# Patient Record
Sex: Female | Born: 1985 | Race: White | Hispanic: No | Marital: Single | Smoking: Current every day smoker
Health system: Southern US, Community
[De-identification: ages and names within clinical notes are randomized; demographics above are authoritative.]

## PROBLEM LIST (undated history)

## (undated) DIAGNOSIS — N200 Calculus of kidney: Secondary | ICD-10-CM

## (undated) DIAGNOSIS — N390 Urinary tract infection, site not specified: Secondary | ICD-10-CM

## (undated) DIAGNOSIS — S060XAA Concussion with loss of consciousness status unknown, initial encounter: Secondary | ICD-10-CM

## (undated) DIAGNOSIS — S060X9A Concussion with loss of consciousness of unspecified duration, initial encounter: Secondary | ICD-10-CM

---

## 2012-02-24 ENCOUNTER — Emergency Department: Payer: Self-pay | Admitting: Emergency Medicine

## 2012-05-29 ENCOUNTER — Emergency Department: Payer: Self-pay | Admitting: Emergency Medicine

## 2013-01-15 ENCOUNTER — Emergency Department: Payer: Self-pay | Admitting: Emergency Medicine

## 2013-08-13 ENCOUNTER — Emergency Department: Payer: Self-pay | Admitting: Emergency Medicine

## 2013-08-13 LAB — COMPREHENSIVE METABOLIC PANEL
ALBUMIN: 4 g/dL (ref 3.4–5.0)
Alkaline Phosphatase: 58 U/L
Anion Gap: 6 — ABNORMAL LOW (ref 7–16)
BILIRUBIN TOTAL: 0.5 mg/dL (ref 0.2–1.0)
BUN: 10 mg/dL (ref 7–18)
Calcium, Total: 9.1 mg/dL (ref 8.5–10.1)
Chloride: 108 mmol/L — ABNORMAL HIGH (ref 98–107)
Co2: 24 mmol/L (ref 21–32)
Creatinine: 0.86 mg/dL (ref 0.60–1.30)
EGFR (Non-African Amer.): >60
Glucose: 95 mg/dL (ref 65–99)
OSMOLALITY: 275 (ref 275–301)
Potassium: 3.4 mmol/L — ABNORMAL LOW (ref 3.5–5.1)
SGOT(AST): 23 U/L (ref 15–37)
SGPT (ALT): 16 U/L (ref 12–78)
Sodium: 138 mmol/L (ref 136–145)
TOTAL PROTEIN: 7.8 g/dL (ref 6.4–8.2)

## 2013-08-13 LAB — CBC WITH DIFFERENTIAL/PLATELET
BASOS PCT: 0.8 %
Basophil #: 0.1 10*3/uL (ref 0.0–0.1)
EOS PCT: 1.5 %
Eosinophil #: 0.1 10*3/uL (ref 0.0–0.7)
HCT: 40.3 % (ref 35.0–47.0)
HGB: 13.8 g/dL (ref 12.0–16.0)
Lymphocyte #: 2.9 10*3/uL (ref 1.0–3.6)
Lymphocyte %: 36.3 %
MCH: 32.9 pg (ref 26.0–34.0)
MCHC: 34.1 g/dL (ref 32.0–36.0)
MCV: 96 fL (ref 80–100)
MONO ABS: 0.5 x10 3/mm (ref 0.2–0.9)
Monocyte %: 5.6 %
Neutrophil #: 4.5 10*3/uL (ref 1.4–6.5)
Neutrophil %: 55.8 %
Platelet: 267 10*3/uL (ref 150–440)
RBC: 4.18 10*6/uL (ref 3.80–5.20)
RDW: 12.8 % (ref 11.5–14.5)
WBC: 8.1 10*3/uL (ref 3.6–11.0)

## 2013-08-13 LAB — LIPASE, BLOOD: Lipase: 242 U/L (ref 73–393)

## 2013-08-13 LAB — URINALYSIS, COMPLETE
Bilirubin,UR: NEGATIVE
Blood: NEGATIVE
GLUCOSE, UR: NEGATIVE mg/dL (ref 0–75)
Ketone: NEGATIVE
Nitrite: NEGATIVE
Ph: 5 (ref 4.5–8.0)
Protein: 30
RBC,UR: 11 /HPF (ref 0–5)
Specific Gravity: 1.024 (ref 1.003–1.030)
WBC UR: 8 /HPF (ref 0–5)

## 2013-08-15 ENCOUNTER — Emergency Department: Payer: Self-pay | Admitting: Emergency Medicine

## 2013-08-15 LAB — CBC
HCT: 40.9 % (ref 35.0–47.0)
HGB: 14 g/dL (ref 12.0–16.0)
MCH: 32.5 pg (ref 26.0–34.0)
MCHC: 34.1 g/dL (ref 32.0–36.0)
MCV: 95 fL (ref 80–100)
Platelet: 274 10*3/uL (ref 150–440)
RBC: 4.29 10*6/uL (ref 3.80–5.20)
RDW: 12.9 % (ref 11.5–14.5)
WBC: 10.2 10*3/uL (ref 3.6–11.0)

## 2013-08-15 LAB — COMPREHENSIVE METABOLIC PANEL
ANION GAP: 7 (ref 7–16)
Albumin: 4.1 g/dL (ref 3.4–5.0)
Alkaline Phosphatase: 60 U/L
BUN: 9 mg/dL (ref 7–18)
Bilirubin,Total: 0.4 mg/dL (ref 0.2–1.0)
CALCIUM: 9.1 mg/dL (ref 8.5–10.1)
CREATININE: 0.57 mg/dL — AB (ref 0.60–1.30)
Chloride: 109 mmol/L — ABNORMAL HIGH (ref 98–107)
Co2: 21 mmol/L (ref 21–32)
EGFR (African American): >60
Glucose: 88 mg/dL (ref 65–99)
Osmolality: 272 (ref 275–301)
Potassium: 4.4 mmol/L (ref 3.5–5.1)
SGOT(AST): 52 U/L — ABNORMAL HIGH (ref 15–37)
SGPT (ALT): 20 U/L (ref 12–78)
Sodium: 137 mmol/L (ref 136–145)
TOTAL PROTEIN: 8.2 g/dL (ref 6.4–8.2)

## 2013-08-15 LAB — HCG, QUANTITATIVE, PREGNANCY

## 2013-08-15 LAB — LIPASE, BLOOD: LIPASE: 217 U/L (ref 73–393)

## 2014-09-18 ENCOUNTER — Emergency Department
Admit: 2014-09-18 | Disposition: A | Discharge status: Home / Self Care | Payer: Self-pay | Admitting: Emergency Medicine

## 2014-09-18 LAB — URINALYSIS, COMPLETE
Bilirubin,UR: NEGATIVE
Glucose,UR: 50 mg/dL (ref 0–75)
Hyaline Cast: 10
LEUKOCYTE ESTERASE: NEGATIVE
Nitrite: NEGATIVE
Ph: 5 (ref 4.5–8.0)
Protein: 100
RBC,UR: 279 /HPF (ref 0–5)
Specific Gravity: 1.023 (ref 1.003–1.030)
Squamous Epithelial: 9

## 2014-09-18 LAB — COMPREHENSIVE METABOLIC PANEL
ALBUMIN: 4.7 g/dL
ANION GAP: 10 (ref 7–16)
Alkaline Phosphatase: 55 U/L
BILIRUBIN TOTAL: 0.7 mg/dL
BUN: 15 mg/dL
Calcium, Total: 9.4 mg/dL
Chloride: 107 mmol/L
Co2: 21 mmol/L — ABNORMAL LOW
Creatinine: 1.01 mg/dL — ABNORMAL HIGH
EGFR (African American): >60
EGFR (Non-African Amer.): >60
Glucose: 100 mg/dL — ABNORMAL HIGH
Potassium: 3.7 mmol/L
SGOT(AST): 27 U/L
SGPT (ALT): 20 U/L
Sodium: 138 mmol/L
TOTAL PROTEIN: 8.1 g/dL

## 2014-09-18 LAB — CBC
HCT: 41.2 % (ref 35.0–47.0)
HGB: 13.7 g/dL (ref 12.0–16.0)
MCH: 31.5 pg (ref 26.0–34.0)
MCHC: 33.2 g/dL (ref 32.0–36.0)
MCV: 95 fL (ref 80–100)
PLATELETS: 310 10*3/uL (ref 150–440)
RBC: 4.34 10*6/uL (ref 3.80–5.20)
RDW: 12.8 % (ref 11.5–14.5)
WBC: 13.8 10*3/uL — ABNORMAL HIGH (ref 3.6–11.0)

## 2014-09-18 LAB — LIPASE, BLOOD: LIPASE: 52 U/L — AB

## 2014-12-20 ENCOUNTER — Encounter: Payer: Self-pay | Admitting: *Deleted

## 2014-12-20 ENCOUNTER — Emergency Department
Admission: EM | Admit: 2014-12-20 | Discharge: 2014-12-20 | Disposition: A | Discharge status: Home / Self Care | Payer: BC Managed Care – PPO | Attending: Emergency Medicine | Admitting: Emergency Medicine

## 2014-12-20 DIAGNOSIS — R109 Unspecified abdominal pain: Secondary | ICD-10-CM | POA: Diagnosis present

## 2014-12-20 DIAGNOSIS — Z3202 Encounter for pregnancy test, result negative: Secondary | ICD-10-CM | POA: Insufficient documentation

## 2014-12-20 DIAGNOSIS — N200 Calculus of kidney: Secondary | ICD-10-CM | POA: Diagnosis not present

## 2014-12-20 HISTORY — DX: Calculus of kidney: N20.0

## 2014-12-20 LAB — CBC WITH DIFFERENTIAL/PLATELET
Basophils Absolute: 0.1 10*3/uL (ref 0–0.1)
Basophils Relative: 1 %
EOS PCT: 2 %
Eosinophils Absolute: 0.3 10*3/uL (ref 0–0.7)
HCT: 38.4 % (ref 35.0–47.0)
HEMOGLOBIN: 12.8 g/dL (ref 12.0–16.0)
LYMPHS ABS: 2 10*3/uL (ref 1.0–3.6)
Lymphocytes Relative: 15 %
MCH: 31.6 pg (ref 26.0–34.0)
MCHC: 33.3 g/dL (ref 32.0–36.0)
MCV: 95 fL (ref 80.0–100.0)
MONOS PCT: 6 %
Monocytes Absolute: 0.8 10*3/uL (ref 0.2–0.9)
Neutro Abs: 9.8 10*3/uL — ABNORMAL HIGH (ref 1.4–6.5)
Neutrophils Relative %: 76 %
PLATELETS: 265 10*3/uL (ref 150–440)
RBC: 4.04 MIL/uL (ref 3.80–5.20)
RDW: 13.1 % (ref 11.5–14.5)
WBC: 12.9 10*3/uL — AB (ref 3.6–11.0)

## 2014-12-20 LAB — BASIC METABOLIC PANEL
Anion gap: 8 (ref 5–15)
BUN: 13 mg/dL (ref 6–20)
CO2: 24 mmol/L (ref 22–32)
Calcium: 9.2 mg/dL (ref 8.9–10.3)
Chloride: 109 mmol/L (ref 101–111)
Creatinine, Ser: 0.78 mg/dL (ref 0.44–1.00)
GFR calc Af Amer: >60 mL/min (ref >60)
GFR calc non Af Amer: >60 mL/min (ref >60)
Glucose, Bld: 104 mg/dL — ABNORMAL HIGH (ref 65–99)
Potassium: 4 mmol/L (ref 3.5–5.1)
Sodium: 141 mmol/L (ref 135–145)

## 2014-12-20 LAB — POCT PREGNANCY, URINE: Preg Test, Ur: NEGATIVE

## 2014-12-20 LAB — URINALYSIS COMPLETE WITH MICROSCOPIC (ARMC ONLY)
Bilirubin Urine: NEGATIVE
Glucose, UA: NEGATIVE mg/dL
Ketones, ur: NEGATIVE mg/dL
Nitrite: NEGATIVE
Protein, ur: 30 mg/dL — AB
Specific Gravity, Urine: 1.021 (ref 1.005–1.030)
pH: 5 (ref 5.0–8.0)

## 2014-12-20 MED ORDER — ONDANSETRON HCL 4 MG/2ML IJ SOLN
4.0000 mg | Freq: Once | INTRAMUSCULAR | Status: AC
  Administered 2014-12-20: 4 mg via INTRAVENOUS
  Filled 2014-12-20: qty 2

## 2014-12-20 MED ORDER — OXYCODONE-ACETAMINOPHEN 5-325 MG PO TABS: 1.0000 | ORAL_TABLET | ORAL | Status: DC | PRN

## 2014-12-20 MED ORDER — KETOROLAC TROMETHAMINE 30 MG/ML IJ SOLN
30.0000 mg | Freq: Once | INTRAMUSCULAR | Status: AC
  Administered 2014-12-20: 30 mg via INTRAVENOUS
  Filled 2014-12-20: qty 1

## 2014-12-20 MED ORDER — CEPHALEXIN 500 MG PO CAPS: 500.0000 mg | ORAL_CAPSULE | Freq: Three times a day (TID) | ORAL | Status: AC

## 2014-12-20 MED ORDER — MORPHINE SULFATE 4 MG/ML IJ SOLN
4.0000 mg | Freq: Once | INTRAMUSCULAR | Status: AC
  Administered 2014-12-20: 4 mg via INTRAVENOUS
  Filled 2014-12-20: qty 1

## 2014-12-20 MED ORDER — TAMSULOSIN HCL 0.4 MG PO CAPS: 0.4000 mg | ORAL_CAPSULE | Freq: Every day | ORAL | Status: DC

## 2014-12-20 NOTE — Discharge Instructions (Signed)
Please seek medical attention for any high fevers, chest pain, shortness of breath, change in behavior, persistent vomiting, bloody stool or any other new or concerning symptoms. ° ° °Kidney Stones °Kidney stones (urolithiasis) are deposits that form inside your kidneys. The intense pain is caused by the stone moving through the urinary tract. When the stone moves, the ureter goes into spasm around the stone. The stone is usually passed in the urine.  °CAUSES  °· A disorder that makes certain neck glands produce too much parathyroid hormone (primary hyperparathyroidism). °· A buildup of uric acid crystals, similar to gout in your joints. °· Narrowing (stricture) of the ureter. °· A kidney obstruction present at birth (congenital obstruction). °· Previous surgery on the kidney or ureters. °· Numerous kidney infections. °SYMPTOMS  °· Feeling sick to your stomach (nauseous). °· Throwing up (vomiting). °· Blood in the urine (hematuria). °· Pain that usually spreads (radiates) to the groin. °· Frequency or urgency of urination. °DIAGNOSIS  °· Taking a history and physical exam. °· Blood or urine tests. °· CT scan. °· Occasionally, an examination of the inside of the urinary bladder (cystoscopy) is performed. °TREATMENT  °· Observation. °· Increasing your fluid intake. °· Extracorporeal shock wave lithotripsy--This is a noninvasive procedure that uses shock waves to break up kidney stones. °· Surgery may be needed if you have severe pain or persistent obstruction. There are various surgical procedures. Most of the procedures are performed with the use of small instruments. Only small incisions are needed to accommodate these instruments, so recovery time is minimized. °The size, location, and chemical composition are all important variables that will determine the proper choice of action for you. Talk to your health care provider to better understand your situation so that you will minimize the risk of injury to yourself  and your kidney.  °HOME CARE INSTRUCTIONS  °· Drink enough water and fluids to keep your urine clear or pale yellow. This will help you to pass the stone or stone fragments. °· Strain all urine through the provided strainer. Keep all particulate matter and stones for your health care provider to see. The stone causing the pain may be as small as a grain of salt. It is very important to use the strainer each and every time you pass your urine. The collection of your stone will allow your health care provider to analyze it and verify that a stone has actually passed. The stone analysis will often identify what you can do to reduce the incidence of recurrences. °· Only take over-the-counter or prescription medicines for pain, discomfort, or fever as directed by your health care provider. °· Make a follow-up appointment with your health care provider as directed. °· Get follow-up X-rays if required. The absence of pain does not always mean that the stone has passed. It may have only stopped moving. If the urine remains completely obstructed, it can cause loss of kidney function or even complete destruction of the kidney. It is your responsibility to make sure X-rays and follow-ups are completed. Ultrasounds of the kidney can show blockages and the status of the kidney. Ultrasounds are not associated with any radiation and can be performed easily in a matter of minutes. °SEEK MEDICAL CARE IF: °· You experience pain that is progressive and unresponsive to any pain medicine you have been prescribed. °SEEK IMMEDIATE MEDICAL CARE IF:  °· Pain cannot be controlled with the prescribed medicine. °· You have a fever or shaking chills. °· The severity or intensity   of pain increases over 18 hours and is not relieved by pain medicine. °· You develop a new onset of abdominal pain. °· You feel faint or pass out. °· You are unable to urinate. °MAKE SURE YOU:  °· Understand these instructions. °· Will watch your condition. °· Will get  help right away if you are not doing well or get worse. °Document Released: 05/31/2005 Document Revised: 01/31/2013 Document Reviewed: 11/01/2012 °Exit[employer]® Patient Information ©2015 [employer]. This information is not intended to replace advice given to you by your health care provider. Make sure you discuss any questions you have with your health care provider. ° °

## 2014-12-20 NOTE — ED Provider Notes (Signed)
[facility] Emergency Department Provider Note    ____________________________________________  Time seen: 0320  I have reviewed the triage vital signs and the nursing notes.   HISTORY  Chief Complaint Flank Pain   History limited by: Not Limited   HPI Bridget Hughes is a 29 y.o. female who presents to the emergency department today because of concerns for right-sided abdominal and flank pain. She states it started suddenly 2 hours ago. She describes it as sharp. It does not radiate. It is been constant. She has had some nausea associated with it. She states she has had similar pain in the past. This was when she was diagnosed with a kidney stone roughly 1 month ago. She states she did pass a roughly 5 mm kidney stone. She denies any fevers.     Past Medical History  Diagnosis Date  . Renal stone     There are no active problems to display for this patient.   History reviewed. No pertinent past surgical history.  No current outpatient prescriptions on file.  Allergies Review of patient's allergies indicates no known allergies.  No family history on file.  Social History History  Substance Use Topics  . Smoking status: Not on file  . Smokeless tobacco: Not on file  . Alcohol Use: Not on file    Review of Systems  Constitutional: Negative for fever. Cardiovascular: Negative for chest pain. Respiratory: Negative for shortness of breath. Gastrointestinal: Right-sided flank and abdominal pain. Genitourinary: Negative for dysuria. Musculoskeletal: Negative for back pain. Skin: Negative for rash. Neurological: Negative for headaches, focal weakness or numbness.   10-point ROS otherwise negative.  ____________________________________________   PHYSICAL EXAM:  VITAL SIGNS: ED Triage Vitals  Enc Vitals Group     BP 12/20/14 0252 140/97 mmHg     Pulse Rate 12/20/14 0252 93     Resp --      Temp 12/20/14 0252 97.9 F (36.6 C)     Temp Source 12/20/14 0252 Oral     SpO2 12/20/14 0252 98 %     Weight 12/20/14 0252 140 lb (63.504 kg)     Height 12/20/14 0252 5\' 4"  (1.626 m)     Head Cir --      Peak Flow --      Pain Score 12/20/14 0253 10   Constitutional: Alert and oriented. Well appearing and in no distress. Eyes: Conjunctivae are normal. PERRL. Normal extraocular movements. ENT   Head: Normocephalic and atraumatic.   Nose: No congestion/rhinnorhea.   Mouth/Throat: Mucous membranes are moist.   Neck: No stridor. Hematological/Lymphatic/Immunilogical: No cervical lymphadenopathy. Cardiovascular: Normal rate, regular rhythm.  No murmurs, rubs, or gallops. Respiratory: Normal respiratory effort without tachypnea nor retractions. Breath sounds are clear and equal bilaterally. No wheezes/rales/rhonchi. Gastrointestinal: Soft and nontender. No distention. There is no CVA tenderness. Genitourinary: Deferred Musculoskeletal: Normal range of motion in all extremities. No joint effusions.  No lower extremity tenderness nor edema. Neurologic:  Normal speech and language. No gross focal neurologic deficits are appreciated. Speech is normal.  Skin:  Skin is warm, dry and intact. No rash noted. Psychiatric: Mood and affect are normal. Speech and behavior are normal. Patient exhibits appropriate insight and judgment.  ____________________________________________    LABS (pertinent positives/negatives)  Labs Reviewed  URINALYSIS COMPLETEWITH MICROSCOPIC ([facility] ONLY) - Abnormal; Notable for the following:    Color, Urine YELLOW (*)    APPearance HAZY (*)    Hgb urine dipstick 3+ (*)    Protein, ur 30 (*)  Leukocytes, UA TRACE (*)    Bacteria, UA RARE (*)    Squamous Epithelial / LPF 0-5 (*)    All other components within normal limits  BASIC METABOLIC PANEL - Abnormal; Notable for the following:    Glucose, Bld 104 (*)    All other components within normal limits  CBC WITH DIFFERENTIAL/PLATELET -  Abnormal; Notable for the following:    WBC 12.9 (*)    Neutro Abs 9.8 (*)    All other components within normal limits  POCT PREGNANCY, URINE     ____________________________________________   EKG  none  ____________________________________________    RADIOLOGY  Bedside US renal: mild hydro on the right side  ____________________________________________   PROCEDURES  Procedure(s) performed: None  Critical Care performed: No  ____________________________________________   INITIAL IMPRESSION / ASSESSMENT AND PLAN / ED COURSE  Pertinent labs & imaging results that were available during my care of the patient were reviewed by me and considered in my medical decision making (see chart for details).  Patient presents with right-sided flank and abdominal pain. Patient appears to have kidney stone based on urine. Some leukocytosis so will give antibiotics.   ____________________________________________   FINAL CLINICAL IMPRESSION(S) / ED DIAGNOSES  Final diagnoses:  Kidney stone     Phineas Semen, MD 12/20/14 [phone]

## 2014-12-20 NOTE — ED Notes (Signed)
Pt states she is here a month ago with a kidney stone and feels the same tonight. Pain began 2 hours ago. Pain is right flank.

## 2014-12-26 ENCOUNTER — Emergency Department: Payer: BC Managed Care – PPO

## 2014-12-26 ENCOUNTER — Emergency Department
Admission: EM | Admit: 2014-12-26 | Discharge: 2014-12-26 | Disposition: A | Discharge status: Home / Self Care | Payer: BC Managed Care – PPO | Attending: Emergency Medicine | Admitting: Emergency Medicine

## 2014-12-26 ENCOUNTER — Encounter: Payer: Self-pay | Admitting: Emergency Medicine

## 2014-12-26 DIAGNOSIS — Z3202 Encounter for pregnancy test, result negative: Secondary | ICD-10-CM | POA: Insufficient documentation

## 2014-12-26 DIAGNOSIS — R1110 Vomiting, unspecified: Secondary | ICD-10-CM | POA: Insufficient documentation

## 2014-12-26 DIAGNOSIS — R109 Unspecified abdominal pain: Secondary | ICD-10-CM | POA: Diagnosis present

## 2014-12-26 DIAGNOSIS — N23 Unspecified renal colic: Secondary | ICD-10-CM | POA: Insufficient documentation

## 2014-12-26 LAB — CBC WITH DIFFERENTIAL/PLATELET
BASOS PCT: 1 %
Basophils Absolute: 0.1 10*3/uL (ref 0–0.1)
EOS PCT: 3 %
Eosinophils Absolute: 0.3 10*3/uL (ref 0–0.7)
HEMATOCRIT: 42.5 % (ref 35.0–47.0)
Hemoglobin: 14.1 g/dL (ref 12.0–16.0)
LYMPHS ABS: 1.8 10*3/uL (ref 1.0–3.6)
Lymphocytes Relative: 14 %
MCH: 31.3 pg (ref 26.0–34.0)
MCHC: 33.2 g/dL (ref 32.0–36.0)
MCV: 94.3 fL (ref 80.0–100.0)
MONO ABS: 0.8 10*3/uL (ref 0.2–0.9)
Monocytes Relative: 6 %
NEUTROS PCT: 76 %
Neutro Abs: 10.1 10*3/uL — ABNORMAL HIGH (ref 1.4–6.5)
PLATELETS: 267 10*3/uL (ref 150–440)
RBC: 4.5 MIL/uL (ref 3.80–5.20)
RDW: 13.2 % (ref 11.5–14.5)
WBC: 13.1 10*3/uL — AB (ref 3.6–11.0)

## 2014-12-26 LAB — PREGNANCY, URINE: Preg Test, Ur: NEGATIVE

## 2014-12-26 LAB — URINALYSIS COMPLETE WITH MICROSCOPIC (ARMC ONLY)
BILIRUBIN URINE: NEGATIVE
Glucose, UA: NEGATIVE mg/dL
KETONES UR: NEGATIVE mg/dL
Nitrite: NEGATIVE
PROTEIN: NEGATIVE mg/dL
SPECIFIC GRAVITY, URINE: 1.02 (ref 1.005–1.030)
pH: 5 (ref 5.0–8.0)

## 2014-12-26 LAB — BASIC METABOLIC PANEL
Anion gap: 10 (ref 5–15)
BUN: 14 mg/dL (ref 6–20)
CALCIUM: 9.1 mg/dL (ref 8.9–10.3)
CO2: 24 mmol/L (ref 22–32)
Chloride: 103 mmol/L (ref 101–111)
Creatinine, Ser: 0.9 mg/dL (ref 0.44–1.00)
GFR calc Af Amer: >60 mL/min (ref >60)
GLUCOSE: 90 mg/dL (ref 65–99)
Potassium: 3.9 mmol/L (ref 3.5–5.1)
SODIUM: 137 mmol/L (ref 135–145)

## 2014-12-26 LAB — POCT PREGNANCY, URINE: PREG TEST UR: NEGATIVE

## 2014-12-26 MED ORDER — ONDANSETRON HCL 4 MG/2ML IJ SOLN
4.0000 mg | Freq: Once | INTRAMUSCULAR | Status: AC
  Administered 2014-12-26: 4 mg via INTRAVENOUS

## 2014-12-26 MED ORDER — HYDROMORPHONE HCL 1 MG/ML IJ SOLN
INTRAMUSCULAR | Status: AC
  Administered 2014-12-26: 1 mg via INTRAVENOUS
  Filled 2014-12-26: qty 1

## 2014-12-26 MED ORDER — KETOROLAC TROMETHAMINE 30 MG/ML IJ SOLN
INTRAMUSCULAR | Status: AC
  Administered 2014-12-26: 30 mg via INTRAVENOUS
  Filled 2014-12-26: qty 1

## 2014-12-26 MED ORDER — KETOROLAC TROMETHAMINE 10 MG PO TABS: 10.0000 mg | ORAL_TABLET | Freq: Four times a day (QID) | ORAL | Status: DC | PRN

## 2014-12-26 MED ORDER — SODIUM CHLORIDE 0.9 % IV SOLN
Freq: Once | INTRAVENOUS | Status: AC
  Administered 2014-12-26: 16:00:00 via INTRAVENOUS

## 2014-12-26 MED ORDER — OXYCODONE-ACETAMINOPHEN 5-325 MG PO TABS: 1.0000 | ORAL_TABLET | Freq: Four times a day (QID) | ORAL | Status: DC | PRN

## 2014-12-26 MED ORDER — HYDROMORPHONE HCL 1 MG/ML IJ SOLN
1.0000 mg | Freq: Once | INTRAMUSCULAR | Status: AC
  Administered 2014-12-26: 1 mg via INTRAVENOUS

## 2014-12-26 MED ORDER — KETOROLAC TROMETHAMINE 30 MG/ML IJ SOLN
30.0000 mg | Freq: Once | INTRAMUSCULAR | Status: AC
  Administered 2014-12-26: 30 mg via INTRAVENOUS

## 2014-12-26 MED ORDER — ONDANSETRON HCL 4 MG/2ML IJ SOLN
INTRAMUSCULAR | Status: AC
Start: 2014-12-26 — End: 2014-12-26
  Administered 2014-12-26: 4 mg via INTRAVENOUS
  Filled 2014-12-26: qty 2

## 2014-12-26 NOTE — ED Notes (Signed)
Reports dx with kidney stone last wk.  States unable to get f/u apt for another week.  Still having pain

## 2014-12-26 NOTE — ED Notes (Signed)
Pt states diagnosis of a kidney stone last week, pt states she is unable to get into urologist, out of pain medicine and still in pain

## 2014-12-26 NOTE — ED Provider Notes (Signed)
[facility] Emergency Department Provider Note     Time seen: ----------------------------------------- 3:06 PM on 12/26/2014 -----------------------------------------    I have reviewed the triage vital signs and the nursing notes.   HISTORY  Chief Complaint Flank Pain    HPI Bridget Hughes is a 29 y.o. female who presents ER for repeat evaluation for her kidney stone. Patient states she was diagnosed with a kidney stone last week and states she is unable to get to the urologist. Patient states she is out of pain medication and still having significant pain. Patient denies fevers chills other complaints, has had some nausea however. Pain is severe at this time   Past Medical History  Diagnosis Date  . Renal stone     There are no active problems to display for this patient.   History reviewed. No pertinent past surgical history.  Allergies Review of patient's allergies indicates no known allergies.  Social History History  Substance Use Topics  . Smoking status: Never Smoker   . Smokeless tobacco: Not on file  . Alcohol Use: Yes    Review of Systems Constitutional: Negative for fever. Eyes: Negative for visual changes. ENT: Negative for sore throat. Cardiovascular: Negative for chest pain. Respiratory: Negative for shortness of breath. Gastrointestinal: Positive for right flank pain and vomiting Genitourinary: Negative for dysuria. Musculoskeletal: Negative for back pain. Skin: Negative for rash. Neurological: Negative for headaches, focal weakness or numbness.  10-point ROS otherwise negative.  ____________________________________________   PHYSICAL EXAM:  VITAL SIGNS: ED Triage Vitals  Enc Vitals Group     BP --      Pulse Rate 12/26/14 1426 82     Resp 12/26/14 1426 18     Temp 12/26/14 1426 98.1 F (36.7 C)     Temp Source 12/26/14 1426 Oral     SpO2 12/26/14 1426 99 %     Weight 12/26/14 1426 150 lb (68.04 kg)    Height 12/26/14 1426 5\' 4"  (1.626 m)     Head Cir --      Peak Flow --      Pain Score 12/26/14 1426 8     Pain Loc --      Pain Edu? --      Excl. in GC? --     Constitutional: Alert and oriented. Well appearing and in no distress. Eyes: Conjunctivae are normal. PERRL. Normal extraocular movements. ENT   Head: Normocephalic and atraumatic.   Nose: No congestion/rhinnorhea.   Mouth/Throat: Mucous membranes are moist.   Neck: No stridor. Hematological/Lymphatic/Immunilogical: No cervical lymphadenopathy. Cardiovascular: Normal rate, regular rhythm. Normal and symmetric distal pulses are present in all extremities. No murmurs, rubs, or gallops. Respiratory: Normal respiratory effort without tachypnea nor retractions. Breath sounds are clear and equal bilaterally. No wheezes/rales/rhonchi. Gastrointestinal: Right flank tenderness, no rebound or guarding. Musculoskeletal: Nontender with normal range of motion in all extremities. No joint effusions.  No lower extremity tenderness nor edema. Neurologic:  Normal speech and language. No gross focal neurologic deficits are appreciated. Speech is normal. No gait instability. Skin:  Skin is warm, dry and intact. No rash noted. Psychiatric: Mood and affect are normal. Speech and behavior are normal. Patient exhibits appropriate insight and judgment.  ____________________________________________  ED COURSE:  Pertinent labs & imaging results that were available during my care of the patient were reviewed by me and considered in my medical decision making (see chart for details). Patient with persistent renal colic, we'll obtain a KUB and repeat labs. ____________________________________________  LABS (pertinent positives/negatives)  Labs Reviewed  CBC WITH DIFFERENTIAL/PLATELET - Abnormal; Notable for the following:    WBC 13.1 (*)    Neutro Abs 10.1 (*)    All other components within normal limits  URINALYSIS COMPLETEWITH  MICROSCOPIC ([facility] ONLY) - Abnormal; Notable for the following:    Color, Urine YELLOW (*)    APPearance HAZY (*)    Hgb urine dipstick 2+ (*)    Leukocytes, UA TRACE (*)    Bacteria, UA FEW (*)    Squamous Epithelial / LPF 6-30 (*)    All other components within normal limits  BASIC METABOLIC PANEL  PREGNANCY, URINE  POCT PREGNANCY, URINE    RADIOLOGY Images were viewed by me  KUB shows progression of the right kidney stone to the right UVJ  ____________________________________________  FINAL ASSESSMENT AND PLAN  Renal colic  Plan: Patient is in no acute distress, labs and imaging as dictated above. Patient is feeling better, will continue home with oral ketorolac and Flomax.   Emily Filbert, MD   Emily Filbert, MD 12/26/14 [phone]

## 2014-12-26 NOTE — Discharge Instructions (Signed)

## 2014-12-26 NOTE — ED Notes (Signed)
POC preg negative.

## 2015-05-23 ENCOUNTER — Encounter: Payer: Self-pay | Admitting: Emergency Medicine

## 2015-05-23 ENCOUNTER — Emergency Department: Payer: BC Managed Care – PPO

## 2015-05-23 ENCOUNTER — Emergency Department
Admission: EM | Admit: 2015-05-23 | Discharge: 2015-05-23 | Disposition: A | Discharge status: Home / Self Care | Payer: BC Managed Care – PPO | Attending: Emergency Medicine | Admitting: Emergency Medicine

## 2015-05-23 DIAGNOSIS — Y92322 Soccer field as the place of occurrence of the external cause: Secondary | ICD-10-CM | POA: Insufficient documentation

## 2015-05-23 DIAGNOSIS — M2392 Unspecified internal derangement of left knee: Secondary | ICD-10-CM

## 2015-05-23 DIAGNOSIS — R110 Nausea: Secondary | ICD-10-CM | POA: Insufficient documentation

## 2015-05-23 DIAGNOSIS — M25562 Pain in left knee: Secondary | ICD-10-CM

## 2015-05-23 DIAGNOSIS — Z79899 Other long term (current) drug therapy: Secondary | ICD-10-CM | POA: Insufficient documentation

## 2015-05-23 DIAGNOSIS — Y9366 Activity, soccer: Secondary | ICD-10-CM | POA: Insufficient documentation

## 2015-05-23 DIAGNOSIS — S8992XA Unspecified injury of left lower leg, initial encounter: Secondary | ICD-10-CM | POA: Diagnosis present

## 2015-05-23 DIAGNOSIS — W01198A Fall on same level from slipping, tripping and stumbling with subsequent striking against other object, initial encounter: Secondary | ICD-10-CM | POA: Diagnosis not present

## 2015-05-23 DIAGNOSIS — Y998 Other external cause status: Secondary | ICD-10-CM | POA: Insufficient documentation

## 2015-05-23 DIAGNOSIS — S83207A Unspecified tear of unspecified meniscus, current injury, left knee, initial encounter: Secondary | ICD-10-CM | POA: Diagnosis not present

## 2015-05-23 MED ORDER — TRAMADOL HCL 50 MG PO TABS
50.0000 mg | ORAL_TABLET | Freq: Once | ORAL | Status: AC
  Administered 2015-05-23: 50 mg via ORAL
  Filled 2015-05-23: qty 1

## 2015-05-23 MED ORDER — TRAMADOL HCL 50 MG PO TABS: 50.0000 mg | ORAL_TABLET | Freq: Four times a day (QID) | ORAL | Status: DC | PRN

## 2015-05-23 MED ORDER — ONDANSETRON 4 MG PO TBDP
4.0000 mg | ORAL_TABLET | Freq: Once | ORAL | Status: AC | PRN
Start: 2015-05-23 — End: 2015-05-23
  Administered 2015-05-23: 4 mg via ORAL
  Filled 2015-05-23: qty 1

## 2015-05-23 MED ORDER — ETODOLAC 500 MG PO TABS: 500.0000 mg | ORAL_TABLET | Freq: Two times a day (BID) | ORAL | Status: DC

## 2015-05-23 NOTE — ED Notes (Signed)
Pt iced and took ibuprofen 600 mg OTC prior to arrival.

## 2015-05-23 NOTE — Discharge Instructions (Signed)
Cryotherapy °Cryotherapy means treatment with cold. Ice or gel packs can be used to reduce both pain and swelling. Ice is the most helpful within the first 24 to 48 hours after an injury or flare-up from overusing a muscle or joint. Sprains, strains, spasms, burning pain, shooting pain, and aches can all be eased with ice. Ice can also be used when recovering from surgery. Ice is effective, has very few side effects, and is safe for most people to use. °PRECAUTIONS  °Ice is not a safe treatment option for people with: °· Raynaud phenomenon. This is a condition affecting small blood vessels in the extremities. Exposure to cold may cause your problems to return. °· Cold hypersensitivity. There are many forms of cold hypersensitivity, including: °¨ Cold urticaria. Red, itchy hives appear on the skin when the tissues begin to warm after being iced. °¨ Cold erythema. This is a red, itchy rash caused by exposure to cold. °¨ Cold hemoglobinuria. Red blood cells break down when the tissues begin to warm after being iced. The hemoglobin that carry oxygen are passed into the urine because they cannot combine with blood proteins fast enough. °· Numbness or altered sensitivity in the area being iced. °If you have any of the following conditions, do not use ice until you have discussed cryotherapy with your caregiver: °· Heart conditions, such as arrhythmia, angina, or chronic heart disease. °· High blood pressure. °· Healing wounds or open skin in the area being iced. °· Current infections. °· Rheumatoid arthritis. °· Poor circulation. °· Diabetes. °Ice slows the blood flow in the region it is applied. This is beneficial when trying to stop inflamed tissues from spreading irritating chemicals to surrounding tissues. However, if you expose your skin to cold temperatures for too long or without the proper protection, you can damage your skin or nerves. Watch for signs of skin damage due to cold. °HOME CARE INSTRUCTIONS °Follow  these tips to use ice and cold packs safely. °· Place a dry or damp towel between the ice and skin. A damp towel will cool the skin more quickly, so you may need to shorten the time that the ice is used. °· For a more rapid response, add gentle compression to the ice. °· Ice for no more than 10 to 20 minutes at a time. The bonier the area you are icing, the less time it will take to get the benefits of ice. °· Check your skin after 5 minutes to make sure there are no signs of a poor response to cold or skin damage. °· Rest 20 minutes or more between uses. °· Once your skin is numb, you can end your treatment. You can test numbness by very lightly touching your skin. The touch should be so light that you do not see the skin dimple from the pressure of your fingertip. When using ice, most people will feel these normal sensations in this order: cold, burning, aching, and numbness. °· Do not use ice on someone who cannot communicate their responses to pain, such as small children or people with dementia. °HOW TO MAKE AN ICE PACK °Ice packs are the most common way to use ice therapy. Other methods include ice massage, ice baths, and cryosprays. Muscle creams that cause a cold, tingly feeling do not offer the same benefits that ice offers and should not be used as a substitute unless recommended by your caregiver. °To make an ice pack, do one of the following: °· Place crushed ice or a   bag of frozen vegetables in a sealable plastic bag. Squeeze out the excess air. Place this bag inside another plastic bag. Slide the bag into a pillowcase or place a damp towel between your skin and the bag. °· Mix 3 parts water with 1 part rubbing alcohol. Freeze the mixture in a sealable plastic bag. When you remove the mixture from the freezer, it will be slushy. Squeeze out the excess air. Place this bag inside another plastic bag. Slide the bag into a pillowcase or place a damp towel between your skin and the bag. °SEEK MEDICAL CARE  IF: °· You develop white spots on your skin. This may give the skin a blotchy (mottled) appearance. °· Your skin turns blue or pale. °· Your skin becomes waxy or hard. °· Your swelling gets worse. °MAKE SURE YOU:  °· Understand these instructions. °· Will watch your condition. °· Will get help right away if you are not doing well or get worse. °  °This information is not intended to replace advice given to you by your health care provider. Make sure you discuss any questions you have with your health care provider. °  °Document Released: 01/25/2011 Document Revised: 06/21/2014 Document Reviewed: 01/25/2011 °[employer]vier Interactive Patient Education ©2016 [employer] °Knee Pain °Knee pain is a common problem. It can have many causes. The pain often goes away by following your doctor's home care instructions. Treatment for ongoing pain will depend on the cause of your pain. If your knee pain continues, more tests may be needed to diagnose your condition. Tests may include X-rays or other imaging studies of your knee. °HOME CARE °· Take medicines only as told by your doctor. °· Rest your knee and keep it raised (elevated) while you are resting. °· Do not do things that cause pain or make your pain worse. °· Avoid activities where both feet leave the ground at the same time, such as running, jumping rope, or doing jumping jacks. °· Apply ice to the knee area: °¨ Put ice in a plastic bag. °¨ Place a towel between your skin and the bag. °¨ Leave the ice on for 20 minutes, 2-3 times a day. °· Ask your doctor if you should wear an elastic knee support. °· Sleep with a pillow under your knee. °· Lose weight if you are overweight. Being overweight can make your knee hurt more. °· Do not use any tobacco products, including cigarettes, chewing tobacco, or electronic cigarettes. If you need help quitting, ask your doctor. Smoking may slow the healing of any bone and joint problems that you may have. °GET HELP IF: °· Your knee  pain does not stop, it changes, or it gets worse. °· You have a fever along with knee pain. °· Your knee gives out or locks up. °· Your knee becomes more swollen. °GET HELP RIGHT AWAY IF:  °· Your knee feels hot to the touch. °· You have chest pain or trouble breathing. °  °This information is not intended to replace advice given to you by your health care provider. Make sure you discuss any questions you have with your health care provider. °  °Document Released: 08/27/2008 Document Revised: 06/21/2014 Document Reviewed: 08/01/2013 °Elsevier Interactive Patient Education ©2016 [employer] ° °

## 2015-05-23 NOTE — ED Notes (Signed)
Pt states she was playing indoor soccer and her knee hit the turf when she fell.  She has a hx of meniscus tears but has had no repairs done to it.  She is in 6/10 pain until it shoots then its a 10/10.  She has treated at home with ice and 600mg  ibuprofen and has not had any pain relief.

## 2015-05-23 NOTE — ED Provider Notes (Signed)
CSN: [id number]     Arrival date & time 05/23/15  2101 History   First MD Initiated Contact with Patient 05/23/15 2210     Chief Complaint  Patient presents with  . Knee Pain  . Nausea     (Consider location/radiation/quality/duration/timing/severity/associated sxs/prior Treatment) HPI  29 year old female presents to emergency department for evaluation of left knee pain. She has a known meniscus tear that was treated nonoperatively in the left knee. Just prior to arrival she was playing soccer, pivoted, went down onto her left knee and developed pain and swelling. Pain is on the medial joint line. She is unable to bear weight. She is able to straight leg raise. Pain is sharp and 10 out of 10. She did take 600 mg ibuprofen. On arrival to the emergency Department pain was so severe that she was nauseated, she was given Zofran which is completely alleviated her nausea. She denies any headaches, neck pain, nausea, vomiting, abdominal pain  Past Medical History  Diagnosis Date  . Renal stone    History reviewed. No pertinent past surgical history. No family history on file. Social History  Substance Use Topics  . Smoking status: Never Smoker   . Smokeless tobacco: None  . Alcohol Use: No   OB History    No data available     Review of Systems  Constitutional: Negative for fever, chills, activity change and fatigue.  HENT: Negative for congestion, sinus pressure and sore throat.   Eyes: Negative for visual disturbance.  Respiratory: Negative for cough, chest tightness and shortness of breath.   Cardiovascular: Negative for chest pain and leg swelling.  Gastrointestinal: Negative for nausea, vomiting, abdominal pain and diarrhea.  Genitourinary: Negative for dysuria.  Musculoskeletal: Positive for arthralgias. Negative for gait problem.  Skin: Negative for rash.  Neurological: Negative for weakness, numbness and headaches.  Hematological: Negative for adenopathy.   Psychiatric/Behavioral: Negative for behavioral problems, confusion and agitation.      Allergies  Review of patient's allergies indicates no known allergies.  Home Medications   Prior to Admission medications   Medication Sig Start Date End Date Taking? Authorizing Provider  etodolac (LODINE) 500 MG tablet Take 1 tablet (500 mg total) by mouth 2 (two) times daily. 05/23/15   Evon Slack, PA-C  ketorolac (TORADOL) 10 MG tablet Take 1 tablet (10 mg total) by mouth every 6 (six) hours as needed. 12/26/14   Emily Filbert, MD  oxyCODONE-acetaminophen (ROXICET) 5-325 MG per tablet Take 1 tablet by mouth every 4 (four) hours as needed for severe pain. 12/20/14   Phineas Semen, MD  oxyCODONE-acetaminophen (ROXICET) 5-325 MG per tablet Take 1 tablet by mouth every 6 (six) hours as needed. 12/26/14   Emily Filbert, MD  tamsulosin (FLOMAX) 0.4 MG CAPS capsule Take 1 capsule (0.4 mg total) by mouth daily. 12/20/14   Phineas Semen, MD  traMADol (ULTRAM) 50 MG tablet Take 1 tablet (50 mg total) by mouth every 6 (six) hours as needed. 05/23/15   Evon Slack, PA-C   BP 113/75 mmHg  Pulse 85  Temp(Src) 97.6 F (36.4 C) (Oral)  Resp 18  Ht  (1.6 m)  Wt 65.772 kg  BMI 25.69 kg/m2  SpO2 97%  LMP 05/23/2015 (Exact Date) Physical Exam  Constitutional: She is oriented to person, place, and time. She appears well-developed and well-nourished. No distress.  HENT:  Head: Normocephalic and atraumatic.  Mouth/Throat: Oropharynx is clear and moist.  Eyes: EOM are normal. Pupils are equal,  round, and reactive to light. Right eye exhibits no discharge. Left eye exhibits no discharge.  Neck: Normal range of motion. Neck supple.  Cardiovascular: Normal rate, regular rhythm and intact distal pulses.   Pulmonary/Chest: Effort normal and breath sounds normal. No respiratory distress. She exhibits no tenderness.  Musculoskeletal:  Examination of left knee shows the patient has 0-100 range  of motion. There is mild swelling with no effusion. She is tender on the medial joint line nontender along the lateral joint line. She is able to actively straight leg raise. There is no laxity with valgus or varus stress testing. She has a negative anterior posterior drawer test. McMurray's test is unable to be performed due to pain. No pain with hip internal or external rotation.  Neurological: She is alert and oriented to person, place, and time. She has normal reflexes.  Skin: Skin is warm and dry.  Psychiatric: She has a normal mood and affect. Her behavior is normal. Thought content normal.    ED Course  Procedures (including critical care time) Labs Review Labs Reviewed - No data to display  Imaging Review Dg Knee Complete 4 Views Left  05/23/2015  CLINICAL DATA:  Acute left knee pain after soccer injury tonight. EXAM: LEFT KNEE - COMPLETE 4+ VIEW COMPARISON:  January 15, 2013. FINDINGS: There is no evidence of fracture, dislocation, or joint effusion. There is no evidence of arthropathy or other focal bone abnormality. Soft tissues are unremarkable. IMPRESSION: Normal left knee. Electronically Signed   By: Lupita RaiderJames  Green Jr, M.D.   On: 05/23/2015 21:53   I have personally reviewed and evaluated these images and lab results as part of my medical decision-making.   EKG Interpretation None      MDM   Final diagnoses:  Left knee pain  Derangement, knee internal, left    29 year old female with left knee pain from a twisting injury. She has a no medial meniscus tear. Most likely, patient reinjured her left knee. There is no evidence of acute bony abnormality on x-rays. No signs of effusion. Patient is placed into a knee immobilizer, will wear at all times except for when sitting. She can come out to work on gentle knee range of motion. She is given crutches. Etodolac and Ultram for pain. Follow up with orthopedics in 3-5 days.    Evon Slackhomas C Tifanie Gardiner, PA-C 05/23/15 2221  Phineas SemenGraydon  Goodman, MD 05/24/15 [phone]

## 2015-07-24 ENCOUNTER — Emergency Department
Admission: EM | Admit: 2015-07-24 | Discharge: 2015-07-24 | Disposition: A | Discharge status: Home / Self Care | Payer: BC Managed Care – PPO | Attending: Student | Admitting: Student

## 2015-07-24 ENCOUNTER — Encounter: Payer: Self-pay | Admitting: *Deleted

## 2015-07-24 ENCOUNTER — Emergency Department: Payer: BC Managed Care – PPO

## 2015-07-24 DIAGNOSIS — J069 Acute upper respiratory infection, unspecified: Secondary | ICD-10-CM | POA: Diagnosis not present

## 2015-07-24 DIAGNOSIS — R0781 Pleurodynia: Secondary | ICD-10-CM | POA: Diagnosis not present

## 2015-07-24 DIAGNOSIS — Z79899 Other long term (current) drug therapy: Secondary | ICD-10-CM | POA: Diagnosis not present

## 2015-07-24 DIAGNOSIS — F17200 Nicotine dependence, unspecified, uncomplicated: Secondary | ICD-10-CM | POA: Insufficient documentation

## 2015-07-24 DIAGNOSIS — R0789 Other chest pain: Secondary | ICD-10-CM

## 2015-07-24 DIAGNOSIS — R0981 Nasal congestion: Secondary | ICD-10-CM | POA: Diagnosis present

## 2015-07-24 MED ORDER — PSEUDOEPH-BROMPHEN-DM 30-2-10 MG/5ML PO SYRP: 5.0000 mL | ORAL_SOLUTION | Freq: Four times a day (QID) | ORAL | Status: DC | PRN

## 2015-07-24 MED ORDER — PREDNISONE 10 MG PO TABS: ORAL_TABLET | ORAL | Status: DC

## 2015-07-24 NOTE — ED Provider Notes (Signed)
[facility] Emergency Department Provider Note  ____________________________________________  Time seen: Approximately 4:48 PM  I have reviewed the triage vital signs and the nursing notes.   HISTORY  Chief Complaint Nasal Congestion and Chills    HPI Bridget Hughes is a 30 y.o. female , NAD, presents to the emergency department with a one-week history of upper respiratory symptoms. Has had nasal drainage and congestion as well as cough. The last 24 hours has had increasing right-sided lower rib pain that worsens with cough. Symptoms improved with taking Sudafed but once the cough returns, the pain gets worse. Denies fever, chills, body aches. No difficulty breathing or shortness of breath.    Past Medical History  Diagnosis Date  . Renal stone     There are no active problems to display for this patient.   History reviewed. No pertinent past surgical history.  Current Outpatient Rx  Name  Route  Sig  Dispense  Refill  . brompheniramine-pseudoephedrine-DM 30-2-10 MG/5ML syrup   Oral   Take 5 mLs by mouth 4 (four) times daily as needed.   118 mL   0   . etodolac (LODINE) 500 MG tablet   Oral   Take 1 tablet (500 mg total) by mouth 2 (two) times daily.   30 tablet   0   . ketorolac (TORADOL) 10 MG tablet   Oral   Take 1 tablet (10 mg total) by mouth every 6 (six) hours as needed.   20 tablet   0   . oxyCODONE-acetaminophen (ROXICET) 5-325 MG per tablet   Oral   Take 1 tablet by mouth every 4 (four) hours as needed for severe pain.   12 tablet   0   . oxyCODONE-acetaminophen (ROXICET) 5-325 MG per tablet   Oral   Take 1 tablet by mouth every 6 (six) hours as needed.   20 tablet   0   . predniSONE (DELTASONE) 10 MG tablet      Take a daily regimen of 6,5,4,3,2,1   21 tablet   0   . tamsulosin (FLOMAX) 0.4 MG CAPS capsule   Oral   Take 1 capsule (0.4 mg total) by mouth daily.   14 capsule   0   . traMADol (ULTRAM) 50 MG  tablet   Oral   Take 1 tablet (50 mg total) by mouth every 6 (six) hours as needed.   20 tablet   0     Allergies Shellfish allergy  History reviewed. No pertinent family history.  Social History Social History  Substance Use Topics  . Smoking status: Current Every Day Smoker  . Smokeless tobacco: None  . Alcohol Use: No     Review of Systems  Constitutional: No fever/chills Eyes:  No discharge ENT: Positive nasal congestion, nasal drainage. No sore throat. Cardiovascular: No chest pain. Respiratory: Positive cough. No shortness of breath. No wheezing.  Musculoskeletal: Positive for right lateral lower rib pain. Negative for back pain.  Skin: Negative for rash. Neurological: Negative for headaches, focal weakness or numbness. 10-point ROS otherwise negative.  ____________________________________________   PHYSICAL EXAM:  VITAL SIGNS: ED Triage Vitals  Enc Vitals Group     BP 07/24/15 1627 127/89 mmHg     Pulse Rate 07/24/15 1627 84     Resp 07/24/15 1627 18     Temp 07/24/15 1627 97.6 F (36.4 C)     Temp Source 07/24/15 1627 Oral     SpO2 07/24/15 1627 100 %  Weight 07/24/15 1627 145 lb (65.772 kg)     Height 07/24/15 1627  (1.626 m)     Head Cir --      Peak Flow --      Pain Score 07/24/15 1627 0     Pain Loc --      Pain Edu? --      Excl. in GC? --     Constitutional: Alert and oriented. Well appearing and in no acute distress. Eyes: Conjunctivae are normal. PERRL. EOMI without pain.  Head: Atraumatic. ENT:      Ears: Bilateral TMs visualized without perforation. Trace serous fluid bilaterally without erythema. Bilateral external ear canals without swelling, erythema, discharge      Nose: Mild congestion with clear rhinnorhea.      Mouth/Throat: Mucous membranes are moist. Pharynx without erythema, swelling, exudate. Clear postnasal drip. Neck: Supple with full range of motion Hematological/Lymphatic/Immunilogical: No cervical  lymphadenopathy. Cardiovascular: Normal rate, regular rhythm. Normal S1 and S2.  Good peripheral circulation. Respiratory: Normal respiratory effort without tachypnea or retractions. Lungs CTAB. Musculoskeletal:  Tender to palpation over the right lateral lower rib line. No crepitus or deformities to palpation.  Neurologic:  Normal speech and language. No gross focal neurologic deficits are appreciated.  Skin:  Skin is warm, dry and intact. No rash noted. Psychiatric: Mood and affect are normal. Speech and behavior are normal. Patient exhibits appropriate insight and judgement.   ____________________________________________   LABS  None  ____________________________________________  EKG  None ____________________________________________  RADIOLOGY I have personally viewed and evaluated these images (plain radiographs) as part of my medical decision making, as well as reviewing the written report by the radiologist.  Dg Chest 2 View  07/24/2015  CLINICAL DATA:  Shortness of breath, cough and congestion EXAM: CHEST  2 VIEW COMPARISON:  None. FINDINGS: The heart size and mediastinal contours are within normal limits. There is no focal infiltrate, pulmonary edema, or pleural effusion. The visualized skeletal structures are unremarkable. IMPRESSION: No active cardiopulmonary disease. Electronically Signed   By: Sherian Rein M.D.   On: 07/24/2015 17:18    ____________________________________________    PROCEDURES  Procedure(s) performed: None    Medications - No data to display   ____________________________________________   INITIAL IMPRESSION / ASSESSMENT AND PLAN / ED COURSE  Pertinent imaging results that were available during my care of the patient were reviewed by me and considered in my medical decision making (see chart for details).  Patient's diagnosis is consistent with viral upper respiratory infection causing musculoskeletal pain due to cough. Patient will be  discharged home with prescriptions for prednisone 10 mg Dosepak to use as directed as well as Bromfed-DM cough syrup to take 10 mL by mouth every 6 hours as needed for cough and congestion. Patient advised to discontinue Sudafed and DayQuil. Patient is to follow up with [facility] if symptoms persist past this treatment course. Patient is given ED precautions to return to the ED for any worsening or new symptoms.    ____________________________________________  FINAL CLINICAL IMPRESSION(S) / ED DIAGNOSES  Final diagnoses:  Viral upper respiratory illness  Musculoskeletal chest pain      NEW MEDICATIONS STARTED DURING THIS VISIT:  New Prescriptions   BROMPHENIRAMINE-PSEUDOEPHEDRINE-DM 30-2-10 MG/5ML SYRUP    Take 5 mLs by mouth 4 (four) times daily as needed.   PREDNISONE (DELTASONE) 10 MG TABLET    Take a daily regimen of 6,5,4,3,2,1         Rosette Bellavance L Chaunda Vandergriff, PA-C  07/24/15 1729  Gayla Doss, MD 07/25/15 [phone]

## 2015-07-24 NOTE — ED Notes (Signed)
Pt c/o cold S/S for last week.  Pt came in today because of developing pain in R rib cage w/ cough.  Pt denies taking anything for muscle pain but has taken Dayquil Cold and congestion

## 2015-07-24 NOTE — Discharge Instructions (Signed)
Upper Respiratory Infection, Adult °Most upper respiratory infections (URIs) are caused by a virus. A URI affects the nose, throat, and upper air passages. The most common type of URI is often called "the common cold." °HOME CARE  °· Take medicines only as told by your doctor. °· Gargle warm saltwater or take cough drops to comfort your throat as told by your doctor. °· Use a warm mist humidifier or inhale steam from a shower to increase air moisture. This may make it easier to breathe. °· Drink enough fluid to keep your pee (urine) clear or pale yellow. °· Eat soups and other clear broths. °· Have a healthy diet. °· Rest as needed. °· Go back to work when your fever is gone or your doctor says it is okay. °¨ You may need to stay home longer to avoid giving your URI to others. °¨ You can also wear a face mask and wash your hands often to prevent spread of the virus. °· Use your inhaler more if you have asthma. °· Do not use any tobacco products, including cigarettes, chewing tobacco, or electronic cigarettes. If you need help quitting, ask your doctor. °GET HELP IF: °· You are getting worse, not better. °· Your symptoms are not helped by medicine. °· You have chills. °· You are getting more short of breath. °· You have brown or red mucus. °· You have yellow or brown discharge from your nose. °· You have pain in your face, especially when you bend forward. °· You have a fever. °· You have puffy (swollen) neck glands. °· You have pain while swallowing. °· You have white areas in the back of your throat. °GET HELP RIGHT AWAY IF:  °· You have very bad or constant: °· Headache. °· Ear pain. °· Pain in your forehead, behind your eyes, and over your cheekbones (sinus pain). °· Chest pain. °· You have long-lasting (chronic) lung disease and any of the following: °· Wheezing. °· Long-lasting cough. °· Coughing up blood. °· A change in your usual mucus. °· You have a stiff neck. °· You have changes in  your: °· Vision. °· Hearing. °· Thinking. °· Mood. °MAKE SURE YOU:  °· Understand these instructions. °· Will watch your condition. °· Will get help right away if you are not doing well or get worse. °  °This information is not intended to replace advice given to you by your health care provider. Make sure you discuss any questions you have with your health care provider. °  °Document Released: 11/17/2007 Document Revised: 10/15/2014 Document Reviewed: 09/05/2013 °Elsevier Interactive Patient Education ©2016 [employer] ° °Cough, Adult °A cough helps to clear your throat and lungs. A cough may last only 2-3 weeks (acute), or it may last longer than 8 weeks (chronic). Many different things can cause a cough. A cough may be a sign of an illness or another medical condition. °HOME CARE °· Pay attention to any changes in your cough. °· Take medicines only as told by your doctor. °¨ If you were prescribed an antibiotic medicine, take it as told by your doctor. Do not stop taking it even if you start to feel better. °¨ Talk with your doctor before you try using a cough medicine. °· Drink enough fluid to keep your pee (urine) clear or pale yellow. °· If the air is dry, use a cold steam vaporizer or humidifier in your home. °· Stay away from things that make you cough at work or at home. °·   If your cough is worse at night, try using extra pillows to raise your head up higher while you sleep. °· Do not smoke, and try not to be around smoke. If you need help quitting, ask your doctor. °· Do not have caffeine. °· Do not drink alcohol. °· Rest as needed. °GET HELP IF: °· You have new problems (symptoms). °· You cough up yellow fluid (pus). °· Your cough does not get better after 2-3 weeks, or your cough gets worse. °· Medicine does not help your cough and you are not sleeping well. °· You have pain that gets worse or pain that is not helped with medicine. °· You have a fever. °· You are losing weight and you do not know  why. °· You have night sweats. °GET HELP RIGHT AWAY IF: °· You cough up blood. °· You have trouble breathing. °· Your heartbeat is very fast. °  °This information is not intended to replace advice given to you by your health care provider. Make sure you discuss any questions you have with your health care provider. °  °Document Released: 02/11/2011 Document Revised: 02/19/2015 Document Reviewed: 08/07/2014 °Elsevier Interactive Patient Education ©2016 [employer] ° °

## 2015-07-24 NOTE — ED Notes (Signed)
Pt states cough and congestion for several days and right sided rib pain when she coughs

## 2016-10-14 ENCOUNTER — Emergency Department: Payer: BC Managed Care – PPO

## 2016-10-14 ENCOUNTER — Emergency Department
Admission: EM | Admit: 2016-10-14 | Discharge: 2016-10-14 | Disposition: A | Discharge status: Home / Self Care | Payer: BC Managed Care – PPO | Attending: Emergency Medicine | Admitting: Emergency Medicine

## 2016-10-14 DIAGNOSIS — R51 Headache: Secondary | ICD-10-CM | POA: Insufficient documentation

## 2016-10-14 DIAGNOSIS — F17200 Nicotine dependence, unspecified, uncomplicated: Secondary | ICD-10-CM | POA: Diagnosis not present

## 2016-10-14 DIAGNOSIS — Z79899 Other long term (current) drug therapy: Secondary | ICD-10-CM | POA: Insufficient documentation

## 2016-10-14 DIAGNOSIS — R55 Syncope and collapse: Secondary | ICD-10-CM | POA: Diagnosis present

## 2016-10-14 DIAGNOSIS — J029 Acute pharyngitis, unspecified: Secondary | ICD-10-CM | POA: Diagnosis not present

## 2016-10-14 DIAGNOSIS — R05 Cough: Secondary | ICD-10-CM | POA: Insufficient documentation

## 2016-10-14 HISTORY — DX: Concussion with loss of consciousness of unspecified duration, initial encounter: S06.0X9A

## 2016-10-14 HISTORY — DX: Concussion with loss of consciousness status unknown, initial encounter: S06.0XAA

## 2016-10-14 LAB — URINALYSIS, COMPLETE (UACMP) WITH MICROSCOPIC
BACTERIA UA: NONE SEEN
Bilirubin Urine: NEGATIVE
Glucose, UA: NEGATIVE mg/dL
KETONES UR: 20 mg/dL — AB
Leukocytes, UA: NEGATIVE
Nitrite: NEGATIVE
PROTEIN: NEGATIVE mg/dL
Specific Gravity, Urine: 1.021 (ref 1.005–1.030)
pH: 6 (ref 5.0–8.0)

## 2016-10-14 LAB — CBC
HCT: 41.6 % (ref 35.0–47.0)
HEMOGLOBIN: 14.3 g/dL (ref 12.0–16.0)
MCH: 32.2 pg (ref 26.0–34.0)
MCHC: 34.3 g/dL (ref 32.0–36.0)
MCV: 93.7 fL (ref 80.0–100.0)
Platelets: 311 10*3/uL (ref 150–440)
RBC: 4.44 MIL/uL (ref 3.80–5.20)
RDW: 13.2 % (ref 11.5–14.5)
WBC: 7.9 10*3/uL (ref 3.6–11.0)

## 2016-10-14 LAB — BASIC METABOLIC PANEL
Anion gap: 12 (ref 5–15)
BUN: 14 mg/dL (ref 6–20)
CALCIUM: 9.4 mg/dL (ref 8.9–10.3)
CHLORIDE: 108 mmol/L (ref 101–111)
CO2: 20 mmol/L — AB (ref 22–32)
Creatinine, Ser: 0.72 mg/dL (ref 0.44–1.00)
GFR calc non Af Amer: >60 mL/min (ref >60)
GLUCOSE: 97 mg/dL (ref 65–99)
Potassium: 3.3 mmol/L — ABNORMAL LOW (ref 3.5–5.1)
Sodium: 140 mmol/L (ref 135–145)

## 2016-10-14 LAB — MAGNESIUM: MAGNESIUM: 1.8 mg/dL (ref 1.7–2.4)

## 2016-10-14 LAB — POCT RAPID STREP A: STREPTOCOCCUS, GROUP A SCREEN (DIRECT): NEGATIVE

## 2016-10-14 LAB — POCT PREGNANCY, URINE: PREG TEST UR: NEGATIVE

## 2016-10-14 MED ORDER — POTASSIUM CHLORIDE CRYS ER 20 MEQ PO TBCR
40.0000 meq | EXTENDED_RELEASE_TABLET | Freq: Once | ORAL | Status: AC
  Administered 2016-10-14: 40 meq via ORAL
  Filled 2016-10-14: qty 2

## 2016-10-14 MED ORDER — ACETAMINOPHEN 500 MG PO TABS
1000.0000 mg | ORAL_TABLET | Freq: Once | ORAL | Status: AC
  Administered 2016-10-14: 1000 mg via ORAL
  Filled 2016-10-14: qty 2

## 2016-10-14 MED ORDER — SODIUM CHLORIDE 0.9 % IV BOLUS (SEPSIS)
1000.0000 mL | Freq: Once | INTRAVENOUS | Status: AC
  Administered 2016-10-14: 1000 mL via INTRAVENOUS

## 2016-10-14 NOTE — Discharge Instructions (Signed)
You have been seen today in the Emergency Department (ED)  for syncope (passing out).  Your workup including labs and EKG did not show a cause for your syncope and were overall reassuring.   ° °Your symptoms may be due to dehydration, so it is important that you drink plenty of non-alcoholic fluids. Emotional stress, pain, or overheating--especially if you have been standing--can make you faint. In these cases, fainting is usually not serious. But fainting can be a sign of a more serious problem. Therefore it is imperative that you follow up with your doctor in 1-2 days for further evaluation. ° °When should you call for help?  °Call 911 anytime you think you may need emergency care. For example, call if:  °You have symptoms of a heart problem. These may include:  °Chest pain or pressure.  °Severe trouble breathing.  °A fast or irregular heartbeat.  °Lightheadedness or sudden weakness.  °Coughing up pink, foamy mucus.  °Passing out. °You have symptoms of a stroke. These may include:  °Sudden numbness, tingling, weakness, or loss of movement in your face, arm, or leg, especially on only one side of your body.  °Sudden vision changes.  °Sudden trouble speaking.  °Sudden confusion or trouble understanding simple statements.  °Sudden problems with walking or balance.  °A sudden, severe headache that is different from past headaches. °You passed out (lost consciousness) again. ° °Watch closely for changes in your health, and be sure to contact your doctor if:  °You do not get better as expected. ° ° How can you care for yourself at home?  °Drink plenty of fluids to prevent dehydration. If you have kidney, heart, or liver disease and have to limit fluids, talk with your doctor before you increase your fluid intake. ° ° °

## 2016-10-14 NOTE — ED Triage Notes (Addendum)
Pt bib EMS w/ c/o syncopal episode from work.  Pt reports that sycopal episode was witnessed, EMS reports lasting 1-2 seconds. 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  Pt alert and oriented x 4, sts that she was giving incorrect answers earlier to EMS, such as wrong month for birthday.  Resp even and unlabored, able to move all limbs on command NAD. Pt sts that she had PCP appointment today for pink tinged sputum cough and decr appetite

## 2016-10-14 NOTE — ED Notes (Signed)
Pt. returned from XR. 

## 2016-10-14 NOTE — ED Provider Notes (Signed)
[facility] Emergency Department Provider Note  ____________________________________________  Time seen: Approximately 11:33 AM  I have reviewed the triage vital signs and the nursing notes.   HISTORY  Chief Complaint Loss of Consciousness   HPI Bridget Hughes is a 31 y.o. female with h/oconcussion and depression who presents for evaluation of syncopal episode. Patient reports that she was at school today walking around when she felt lightheaded. She lowered herself to the ground and had a syncopal episode lasting 5 seconds. No seizure-like activity, no urinary or bowel loss, no postictal phase. Patient denies injuring herself during this episode. She has no prior history of syncopal episodes. She is a Runner, broadcasting/film/video and also Water quality scientist. She reports that she was started on Celexa by her PCP 2 months ago for depression and has had severely decreased appetite. She reports she hasn't been eating or drinking much over the course of the last 2 months. She is also under a lot of stress with multiple recent local soccer tournaments. She had not eaten anything today. LMP 7 days ago. Patient reports that she played soccer her whole life. No history of syncopal episodes. No family history of sudden death or congenital deafness. She denies palpitations, chest pain, shortness of breath, headache preceding this episode. She does endorse a mild frontal headache since regaining consciousness. She denies abdominal pain, vomiting or diarrhea, dysuria or hematuria areas is also complaining of sore throat and a cough that she has had for a few weeks. No fever, SOB, CP, or chills. She is a smoker.  Past Medical History:  Diagnosis Date  . Concussion   . Renal stone     There are no active problems to display for this patient.   History reviewed. No pertinent surgical history.  Prior to Admission medications   Medication Sig Start Date End Date Taking? Authorizing Provider  citalopram  (CELEXA) 20 MG tablet Take 20 mg by mouth daily. 07/15/16 07/15/17 Yes Historical Provider, MD  traZODone (DESYREL) 50 MG tablet Take 1 tablet by mouth at bedtime as needed. 09/23/16  Yes Historical Provider, MD  brompheniramine-pseudoephedrine-DM 30-2-10 MG/5ML syrup Take 5 mLs by mouth 4 (four) times daily as needed. Patient not taking: Reported on 10/14/2016 07/24/15   Jami L Hagler, PA-C  etodolac (LODINE) 500 MG tablet Take 1 tablet (500 mg total) by mouth 2 (two) times daily. Patient not taking: Reported on 10/14/2016 05/23/15   Evon Slack, PA-C  ketorolac (TORADOL) 10 MG tablet Take 1 tablet (10 mg total) by mouth every 6 (six) hours as needed. Patient not taking: Reported on 10/14/2016 12/26/14   Emily Filbert, MD  oxyCODONE-acetaminophen (ROXICET) 5-325 MG per tablet Take 1 tablet by mouth every 4 (four) hours as needed for severe pain. Patient not taking: Reported on 10/14/2016 12/20/14   Phineas Semen, MD  oxyCODONE-acetaminophen (ROXICET) 5-325 MG per tablet Take 1 tablet by mouth every 6 (six) hours as needed. Patient not taking: Reported on 10/14/2016 12/26/14   Emily Filbert, MD  predniSONE (DELTASONE) 10 MG tablet Take a daily regimen of 6,5,4,3,2,1 Patient not taking: Reported on 10/14/2016 07/24/15   Jami L Hagler, PA-C  SUMAtriptan (IMITREX) 50 MG tablet Take 50 mg by mouth as needed. 08/17/16   Historical Provider, MD  tamsulosin (FLOMAX) 0.4 MG CAPS capsule Take 1 capsule (0.4 mg total) by mouth daily. Patient not taking: Reported on 10/14/2016 12/20/14   Phineas Semen, MD  traMADol (ULTRAM) 50 MG tablet Take 1 tablet (50 mg  total) by mouth every 6 (six) hours as needed. Patient not taking: Reported on 10/14/2016 05/23/15   Evon Slackhomas C Gaines, PA-C    Allergies Bee venom and Shellfish allergy  No family history on file.  Social History Social History  Substance Use Topics  . Smoking status: Current Every Day Smoker  . Smokeless tobacco: Never Used  . Alcohol use No    Review of  Systems  Constitutional: Negative for fever. + syncope Eyes: Negative for visual changes. ENT: + sore throat. Neck: No neck pain  Cardiovascular: Negative for chest pain. Respiratory: Negative for shortness of breath. + cough Gastrointestinal: Negative for abdominal pain, vomiting or diarrhea. + decrease appetite Genitourinary: Negative for dysuria. Musculoskeletal: Negative for back pain. Skin: Negative for rash. Neurological: Negative for weakness or numbness. + HA Psych: No SI or HI  ____________________________________________   PHYSICAL EXAM:  VITAL SIGNS: ED Triage Vitals  Enc Vitals Group     BP 10/14/16 1116 107/77     Pulse Rate 10/14/16 1116 69     Resp 10/14/16 1116 (!) 22     Temp 10/14/16 1116 97.5 F (36.4 C)     Temp Source 10/14/16 1116 Oral     SpO2 10/14/16 1116 100 %     Weight 10/14/16 1113 130 lb (59 kg)     Height 10/14/16 1113 5\' 4"  (1.626 m)     Head Circumference --      Peak Flow --      Pain Score 10/14/16 1112 6     Pain Loc --      Pain Edu? --      Excl. in GC? --     Constitutional: Alert and oriented. Well appearing and in no apparent distress. HEENT:      Head: Normocephalic and atraumatic.         Eyes: Conjunctivae are normal. Sclera is non-icteric. EOMI. PERRL      Mouth/Throat: Mucous membranes are moist. Petechial rash in the soft palate       Neck: Supple with no signs of meningismus. Cardiovascular: Regular rate and rhythm. No murmurs, gallops, or rubs. 2+ symmetrical distal pulses are present in all extremities. No JVD. Respiratory: Normal respiratory effort. Lungs are clear to auscultation bilaterally. No wheezes, crackles, or rhonchi.  Gastrointestinal: Soft, non tender, and non distended with positive bowel sounds. No rebound or guarding. Genitourinary: No CVA tenderness. Musculoskeletal: Nontender with normal range of motion in all extremities. No edema, cyanosis, or erythema of extremities. Neurologic: Normal speech and  language. A & O x3, PERRL, no nystagmus, CN II-XII intact, motor testing reveals good tone and bulk throughout. There is no evidence of pronator drift or dysmetria. Muscle strength is 5/5 throughout. Deep tendon reflexes are 2+ throughout with downgoing toes. Sensory examination is intact. Gait is normal. Skin: Skin is warm, dry and intact. No rash noted. Psychiatric: Mood and affect are normal. Speech and behavior are normal.  ____________________________________________   LABS (all labs ordered are listed, but only abnormal results are displayed)  Labs Reviewed  BASIC METABOLIC PANEL - Abnormal; Notable for the following:       Result Value   Potassium 3.3 (*)    CO2 20 (*)    All other components within normal limits  URINALYSIS, COMPLETE (UACMP) WITH MICROSCOPIC - Abnormal; Notable for the following:    Color, Urine YELLOW (*)    APPearance HAZY (*)    Hgb urine dipstick SMALL (*)    Ketones, ur 20 (*)  Squamous Epithelial / LPF 6-30 (*)    All other components within normal limits  CULTURE, GROUP A STREP (THRC)  CBC  MAGNESIUM  POCT PREGNANCY, URINE  POCT RAPID STREP A   ____________________________________________  EKG  ED ECG REPORT I, Nita Sickle, the attending physician, personally viewed and interpreted this ECG.  Normal sinus rhythm, normal intervals, normal axis, no STE or depressions, no evidence of HOCM, AV block, delta wave, ARVD, prolonged QTc, WPW.  ____________________________________________  RADIOLOGY  CXR: Negative  ____________________________________________   PROCEDURES  Procedure(s) performed: None Procedures Critical Care performed:  None ____________________________________________   INITIAL IMPRESSION / ASSESSMENT AND PLAN / ED COURSE  31 y.o. female with h/oconcussion and depression who presents for evaluation of syncopal episode in the setting of a lot of stress and decreased oral intake for the last 2 months since being  started on Celexa. Patient is extremely well appearing, no distress, vitals are within normal limits, she is neurologically intact, no murmurs on cardiac exam. EKG with no ischemic changes or abnormal findings concerning for malignant/cardiac syncope. We'll check orthostatics, pregnancy, blood work to rule out AKA iron electrolyte abnormalities. We'll monitor patient on telemetry.   ED COURSE: Blood work with no acute findings other than mild hypokalemia which was supplemented by mouth. UA with 20 ketones concerning for possible dehydration. Patient was given IV fluids. Patient was monitored for several hours in the emergency department with no arrhythmias. She felt markedly improved. She was discharged home with close follow-up with her primary care doctor. Patient does have an appointment with her PCP today at 2:00 which she will make after discharge.  Pertinent labs & imaging results that were available during my care of the patient were reviewed by me and considered in my medical decision making (see chart for details).    ____________________________________________   FINAL CLINICAL IMPRESSION(S) / ED DIAGNOSES  Final diagnoses:  Syncope, unspecified syncope type      NEW MEDICATIONS STARTED DURING THIS VISIT:  Discharge Medication List as of 10/14/2016 12:51 PM       Note:  This document was prepared using Dragon voice recognition software and may include unintentional dictation errors.    Nita Sickle, MD 10/14/16 2031

## 2016-10-16 LAB — CULTURE, GROUP A STREP (THRC)

## 2017-02-24 ENCOUNTER — Encounter: Payer: Self-pay | Admitting: *Deleted

## 2017-02-24 ENCOUNTER — Emergency Department
Admission: EM | Admit: 2017-02-24 | Discharge: 2017-02-24 | Disposition: A | Discharge status: Home / Self Care | Payer: BC Managed Care – PPO | Attending: Emergency Medicine | Admitting: Emergency Medicine

## 2017-02-24 DIAGNOSIS — F17200 Nicotine dependence, unspecified, uncomplicated: Secondary | ICD-10-CM | POA: Diagnosis not present

## 2017-02-24 DIAGNOSIS — K047 Periapical abscess without sinus: Secondary | ICD-10-CM | POA: Insufficient documentation

## 2017-02-24 DIAGNOSIS — Z79899 Other long term (current) drug therapy: Secondary | ICD-10-CM | POA: Diagnosis not present

## 2017-02-24 DIAGNOSIS — K0889 Other specified disorders of teeth and supporting structures: Secondary | ICD-10-CM

## 2017-02-24 MED ORDER — KETOROLAC TROMETHAMINE 30 MG/ML IJ SOLN
30.0000 mg | Freq: Once | INTRAMUSCULAR | Status: AC
  Administered 2017-02-24: 30 mg via INTRAMUSCULAR
  Filled 2017-02-24: qty 1

## 2017-02-24 MED ORDER — LIDOCAINE VISCOUS 2 % MT SOLN
15.0000 mL | Freq: Once | OROMUCOSAL | Status: AC
  Administered 2017-02-24: 15 mL via OROMUCOSAL
  Filled 2017-02-24: qty 15

## 2017-02-24 MED ORDER — TRAMADOL HCL 50 MG PO TABS: 50.0000 mg | ORAL_TABLET | Freq: Four times a day (QID) | ORAL | 0 refills | Status: AC | PRN

## 2017-02-24 MED ORDER — AMOXICILLIN 500 MG PO CAPS: 500.0000 mg | ORAL_CAPSULE | Freq: Three times a day (TID) | ORAL | 0 refills | Status: AC

## 2017-02-24 MED ORDER — KETOROLAC TROMETHAMINE 10 MG PO TABS: 10.0000 mg | ORAL_TABLET | Freq: Four times a day (QID) | ORAL | 0 refills | Status: AC | PRN

## 2017-02-24 NOTE — ED Triage Notes (Signed)
Pt complains of dental pain.

## 2017-02-24 NOTE — ED Notes (Signed)
See triage note  States her tooth fell out from left upper gumline  Having increased pain  No swelling noted

## 2017-02-24 NOTE — Discharge Instructions (Signed)
OPTIONS FOR DENTAL FOLLOW UP CARE ° °[place] Department of Health and Human Services - Local Safety Net Dental Clinics °http://www.ncdhhs.gov/dph/oralhealth/services/safetynetclinics.htm °  °[facility] (336-562-3123) ° °[facility] [place] ([phone]) ° °[facility] ([phone] ext [phone]) ° °[facility] ([phone]) ° °[facility] ([phone]) °This clinic caters to the indigent population and is on a lottery system. °Location: °[facility], [address], [place] °Clinic Hours: °Wednesdays from 6pm - 9pm, patients seen by a lottery system. °For dates, call or go to [email or web address].[email or web address]/[facility]/patients/[email or web address] °Services: °Cleanings, fillings and simple extractions. °Payment Options: °DENTAL WORK IS FREE OF CHARGE. Bring proof of income or support. °Best way to get seen: °Arrive at 5:15 pm - this is a lottery, NOT first come/first serve, so arriving earlier will not increase your chances of being seen. °  °  °[facility] °[phone] °Select option 1 for emergencies °  °Location: °[facility], [address], [place] °Clinic Hours: °No walk-ins accepted - call the day before to schedule an appointment. °Check in times are 9:30 am and 1:30 pm. °Services: °Simple extractions, temporary fillings, pulpectomy/pulp debridement, uncomplicated abscess drainage. °Payment Options: °PAYMENT IS DUE AT THE TIME OF SERVICE.  Fee is usually $100-200, additional surgical procedures (e.g. abscess drainage) may be extra. °Cash, checks, Visa/MasterCard accepted.  Can file Medicaid if patient is covered for dental - patient should call case worker to check. °No discount for [facility] patients. °Best way to get seen: °MUST call the day before and get onto the schedule. Can usually be seen the next 1-2 days. No walk-ins accepted. °  °  °[facility] °[phone] °   °Location: °[facility], [address], [place] °Clinic Hours: °M, W, Th, F 8am or 1:30pm, Tues 9a or 1:30 - first come/first served. °Services: °Simple extractions, temporary fillings, uncomplicated abscess drainage.  You do not need to be an [place] resident. °Payment Options: °PAYMENT IS DUE AT THE TIME OF SERVICE. °Dental insurance, otherwise sliding scale - bring proof of income or support. °Depending on income and treatment needed, cost is usually $50-200. °Best way to get seen: °Arrive early as it is first come/first served. °  °  °[facility] °[phone] °  °Location: °[address] °Clinic Hours: °Mon-Thu 8a-5p °Services: °Most basic dental services including extractions and fillings. °Payment Options: °PAYMENT IS DUE AT THE TIME OF SERVICE. °Sliding scale, up to 50% off - bring proof if income or support. °Medicaid with dental option accepted. °Best way to get seen: °Call to schedule an appointment, can usually be seen within 2 weeks OR they will try to see walk-ins - show up at 8a or 2p (you may have to wait). °  °  °[facility] °[phone] °[place] RESIDENTS ONLY °  °Location: °[facility], [address], [place], [place] [address] °Clinic Hours: By appointment only. °Monday - Thursday 8am-5pm, Friday 8am-12pm °Services: Cleanings, fillings, extractions. °Payment Options: °PAYMENT IS DUE AT THE TIME OF SERVICE. °Cash, Visa or MasterCard. Sliding scale - $30 minimum per service. °Best way to get seen: °Come in to office, complete packet and make an appointment - need proof of income °or support monies for each household member and proof of [place] residence. °Usually takes about a month to get in. °  °  °[facility] °[phone] °  °Location: °[address]   [place] °Clinic Hours: Walk-in Urgent Care Dental Services are offered Monday-Friday  mornings only. °The numbers of emergencies accepted daily is limited to the number of °providers available. °Maximum 15 - Mondays, Wednesdays & Thursdays °Maximum 10 - Tuesdays & Fridays °Services: °You do not need to be a [place] resident to be seen for a dental emergency. °Emergencies are defined as pain, swelling, abnormal bleeding, or dental trauma. Walkins will receive x-rays if needed. °NOTE: Dental cleaning is not an emergency. °Payment Options: °PAYMENT IS DUE AT THE TIME OF SERVICE. °Minimum co-pay is $40.00 for uninsured patients. °Minimum co-pay is $3.00 for Medicaid with dental coverage. °Dental Insurance is accepted and must be presented at time of visit. °Medicare does not cover dental. °Forms of payment: Cash, credit card, checks. °Best way to get seen: °If not previously registered with the clinic, walk-in dental registration begins at 7:15 am and is on a first come/first serve basis. °If previously registered with the clinic, call to make an appointment. °  °  °[facility] °[phone] °[place] RESIDENTS ONLY °  °Location: °[address], [place],  °Clinic Hours: °Mon-Thu 10a-2p °Services: Extractions only! °Payment Options: °FREE (donations accepted) - bring proof of income or support °Best way to get seen: °Call and schedule an appointment OR come at 8am on the 1st Monday of every month (except for holidays) when it is first come/first served. °  °  °[facility] °[phone] °  °Location: °[address], [place] °Clinic Hours: °Friday mornings °Services, Payment Options, Best way to get seen: °Call for info °

## 2017-02-24 NOTE — ED Provider Notes (Signed)
[facility] Emergency Department Provider Note  ____________________________________________  Time seen: Approximately 7:42 PM  I have reviewed the triage vital signs and the nursing notes.   HISTORY  Chief Complaint Dental Pain    HPI Bridget Hughes is a 31 y.o. female presents to the emergency department with pain from superior 15. Patient states that she has an appointment with a local dentist on 03/02/2017. Patient states the pain has been excruciating. She has also noticed some left sided cheek fullness. She denies fever or chills. No alleviating measures have been attempted. Patient is a 3rd grade teacher.    Past Medical History:  Diagnosis Date  . Concussion   . Renal stone     There are no active problems to display for this patient.   History reviewed. No pertinent surgical history.  Prior to Admission medications   Medication Sig Start Date End Date Taking? Authorizing Provider  amoxicillin (AMOXIL) 500 MG capsule Take 1 capsule (500 mg total) by mouth 3 (three) times daily. 02/24/17 03/06/17  Orvil Feil, PA-C  brompheniramine-pseudoephedrine-DM 30-2-10 MG/5ML syrup Take 5 mLs by mouth 4 (four) times daily as needed. Patient not taking: Reported on 10/14/2016 07/24/15   Hagler, Jami L, PA-C  citalopram (CELEXA) 20 MG tablet Take 20 mg by mouth daily. 07/15/16 07/15/17  [provider]  etodolac (LODINE) 500 MG tablet Take 1 tablet (500 mg total) by mouth 2 (two) times daily. Patient not taking: Reported on 10/14/2016 05/23/15   Evon Slack, PA-C  ketorolac (TORADOL) 10 MG tablet Take 1 tablet (10 mg total) by mouth every 6 (six) hours as needed. 02/24/17 03/01/17  Orvil Feil, PA-C  oxyCODONE-acetaminophen (ROXICET) 5-325 MG per tablet Take 1 tablet by mouth every 4 (four) hours as needed for severe pain. Patient not taking: Reported on 10/14/2016 12/20/14   Phineas Semen, MD  oxyCODONE-acetaminophen (ROXICET) 5-325 MG per tablet Take  1 tablet by mouth every 6 (six) hours as needed. Patient not taking: Reported on 10/14/2016 12/26/14   Emily Filbert, MD  predniSONE (DELTASONE) 10 MG tablet Take a daily regimen of 6,5,4,3,2,1 Patient not taking: Reported on 10/14/2016 07/24/15   Hagler, Jami L, PA-C  SUMAtriptan (IMITREX) 50 MG tablet Take 50 mg by mouth as needed. 08/17/16   [provider]  tamsulosin (FLOMAX) 0.4 MG CAPS capsule Take 1 capsule (0.4 mg total) by mouth daily. Patient not taking: Reported on 10/14/2016 12/20/14   Phineas Semen, MD  traMADol (ULTRAM) 50 MG tablet Take 1 tablet (50 mg total) by mouth every 6 (six) hours as needed. 02/24/17 03/01/17  Orvil Feil, PA-C  traZODone (DESYREL) 50 MG tablet Take 1 tablet by mouth at bedtime as needed. 09/23/16   [provider]    Allergies Bee venom and Shellfish allergy  No family history on file.  Social History Social History  Substance Use Topics  . Smoking status: Current Every Day Smoker  . Smokeless tobacco: Never Used  . Alcohol use No    Review of Systems  Constitutional: No fever/chills Eyes: No visual changes. No discharge ENT: Patient has dental pain.  Cardiovascular: no chest pain. Respiratory: no cough. No SOB. Musculoskeletal: Negative for musculoskeletal pain. Skin: Negative for rash, abrasions, lacerations, ecchymosis. Neurological: Negative for headaches, focal weakness or numbness. ____________________________________________   PHYSICAL EXAM:  VITAL SIGNS: ED Triage Vitals  Enc Vitals Group     BP 02/24/17 1753 134/79     Pulse Rate 02/24/17 1753 78  Resp 02/24/17 1753 20     Temp 02/24/17 1753 98.3 F (36.8 C)     Temp Source 02/24/17 1753 Oral     SpO2 02/24/17 1753 98 %     Weight 02/24/17 1754 125 lb (56.7 kg)     Height 02/24/17 1754 5\' 3"  (1.6 m)     Head Circumference --      Peak Flow --      Pain Score 02/24/17 1753 10     Pain Loc --      Pain Edu? --      Excl. in GC? --       Constitutional: Alert and oriented. Well appearing and in no acute distress. Eyes: Conjunctivae are normal. PERRL. EOMI. Head: Atraumatic. ENT:      Mouth/Throat: Mucous membranes are moist. Patient has Superior 15 carries. Patient has mild edema of left upper jaw. Cardiovascular: Normal rate, regular rhythm. Normal S1 and S2.  Good peripheral circulation. Respiratory: Normal respiratory effort without tachypnea or retractions. Lungs CTAB. Good air entry to the bases Skin:  Skin is warm, dry and intact. No rash noted. Psychiatric: Mood and affect are normal. Speech and behavior are normal. Patient exhibits appropriate insight and judgement.   ____________________________________________   LABS (all labs ordered are listed, but only abnormal results are displayed)  Labs Reviewed - No data to display ____________________________________________  EKG   ____________________________________________  RADIOLOGY   No results found.  ____________________________________________    PROCEDURES  Procedure(s) performed:    Procedures    Medications  lidocaine (XYLOCAINE) 2 % viscous mouth solution 15 mL (15 mLs Mouth/Throat Given 02/24/17 1921)  ketorolac (TORADOL) 30 MG/ML injection 30 mg (30 mg Intramuscular Given 02/24/17 1921)     ____________________________________________   INITIAL IMPRESSION / ASSESSMENT AND PLAN / ED COURSE  Pertinent labs & imaging results that were available during my care of the patient were reviewed by me and considered in my medical decision making (see chart for details).  Review of the [place] CSRS was performed in accordance of the [facility] prior to dispensing any controlled drugs.     Assessment and Plan:  Dental abscess Patient presents to the emergency department with Superior 15 pain pain and left upper jaw edema. Patient was given Toradol and viscous lidocaine in the emergency department. She was discharged with tramadol, Toradol and  amoxicillin. Patient was advised to keep appointment with local dentist. Vital signs are reassuring prior to discharge.   ____________________________________________  FINAL CLINICAL IMPRESSION(S) / ED DIAGNOSES  Final diagnoses:  Pain, dental      NEW MEDICATIONS STARTED DURING THIS VISIT:  Discharge Medication List as of 02/24/2017  7:11 PM    START taking these medications   Details  amoxicillin (AMOXIL) 500 MG capsule Take 1 capsule (500 mg total) by mouth 3 (three) times daily., Starting Thu 02/24/2017, Until Sun 03/06/2017, Print            This chart was dictated using voice recognition software/Dragon. Despite best efforts to proofread, errors can occur which can change the meaning. Any change was purely unintentional.    Orvil FeilWoods, Billy Turvey M, PA-C 02/24/17 Melida [id number]    Siadecki, Sebastian, MD 02/24/17 [phone]

## 2017-08-02 IMAGING — CR DG CHEST 2V
2 series · 2 of 2 positions shown · non-contrast
Comparison: None.

CLINICAL DATA: Shortness of breath, cough and congestion

EXAM:
CHEST  2 VIEW

[chest pa]
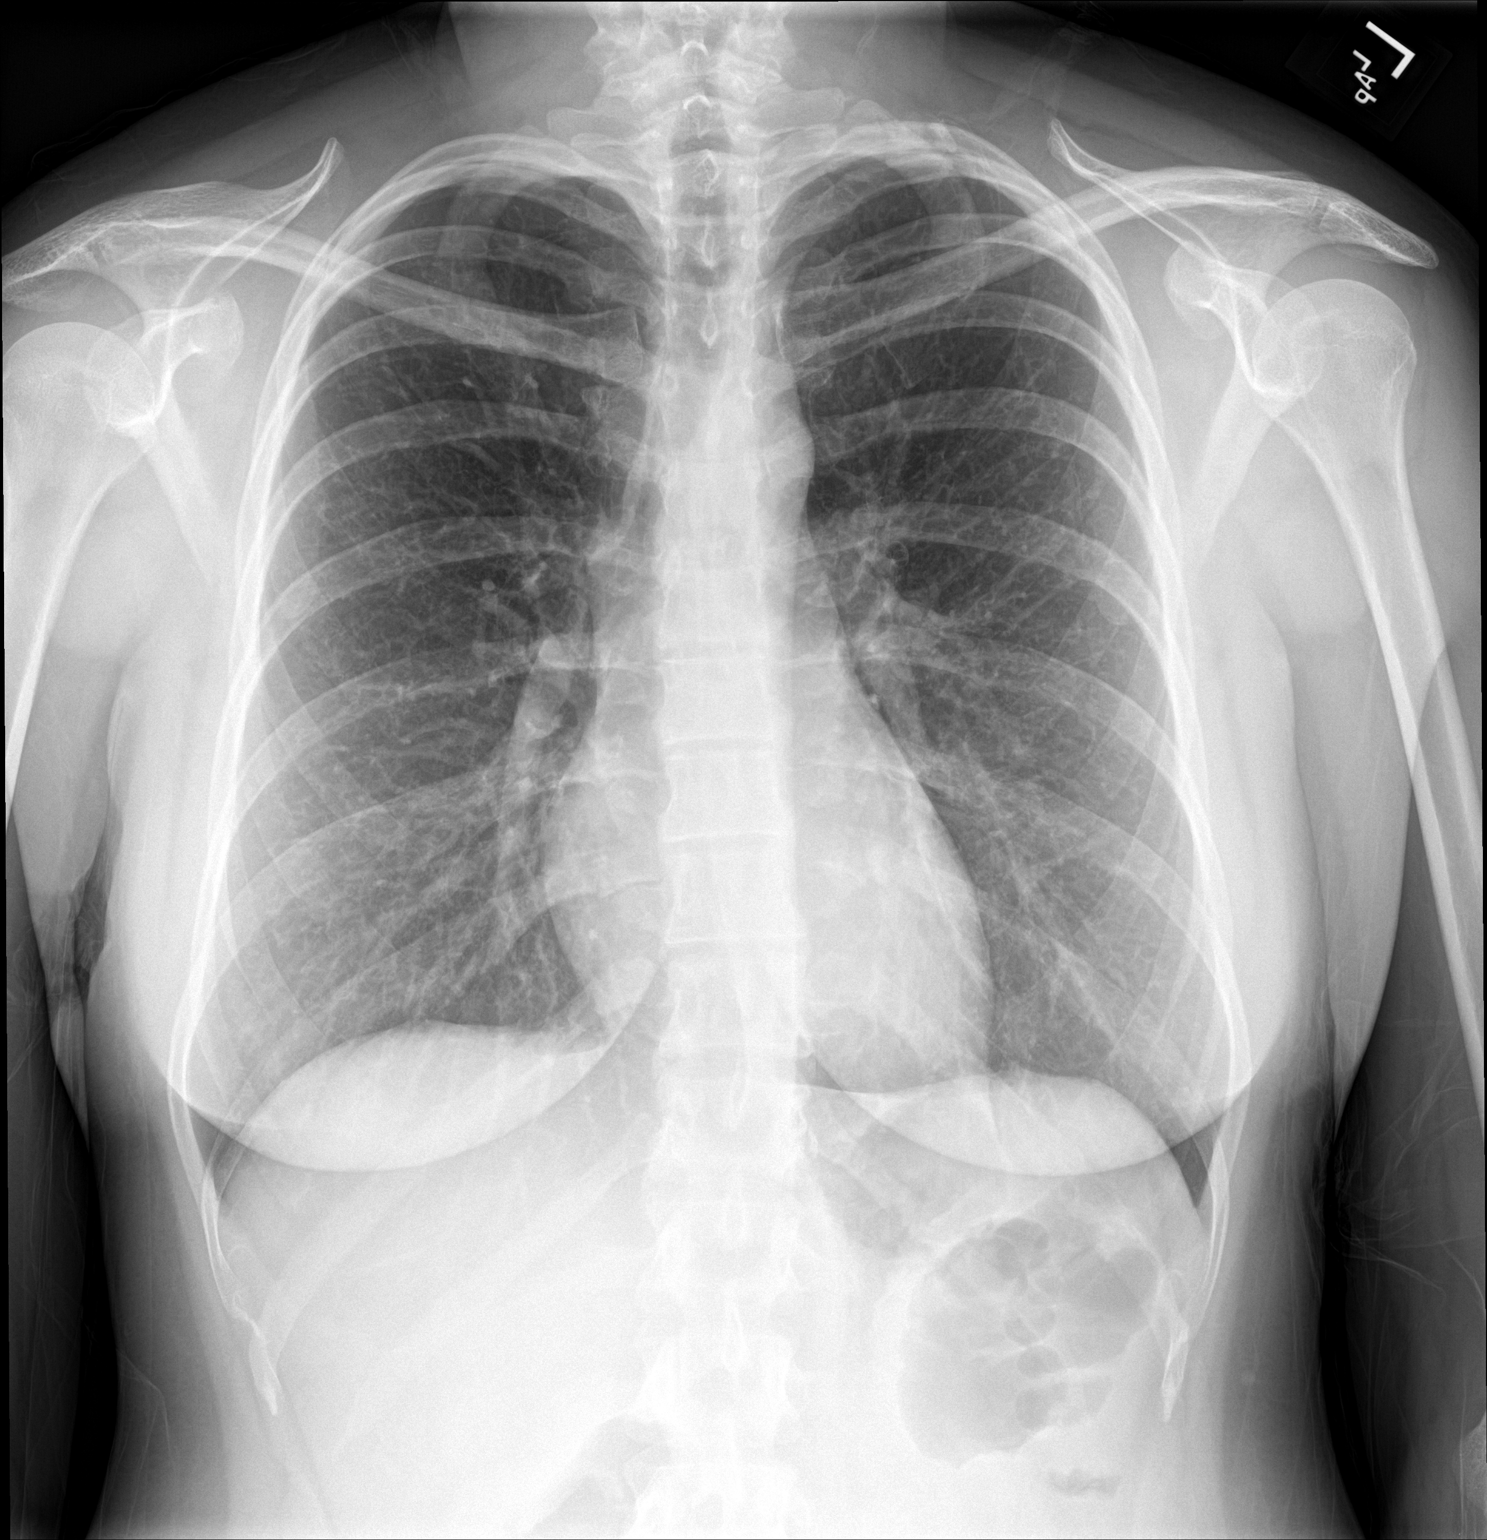

[chest lat]
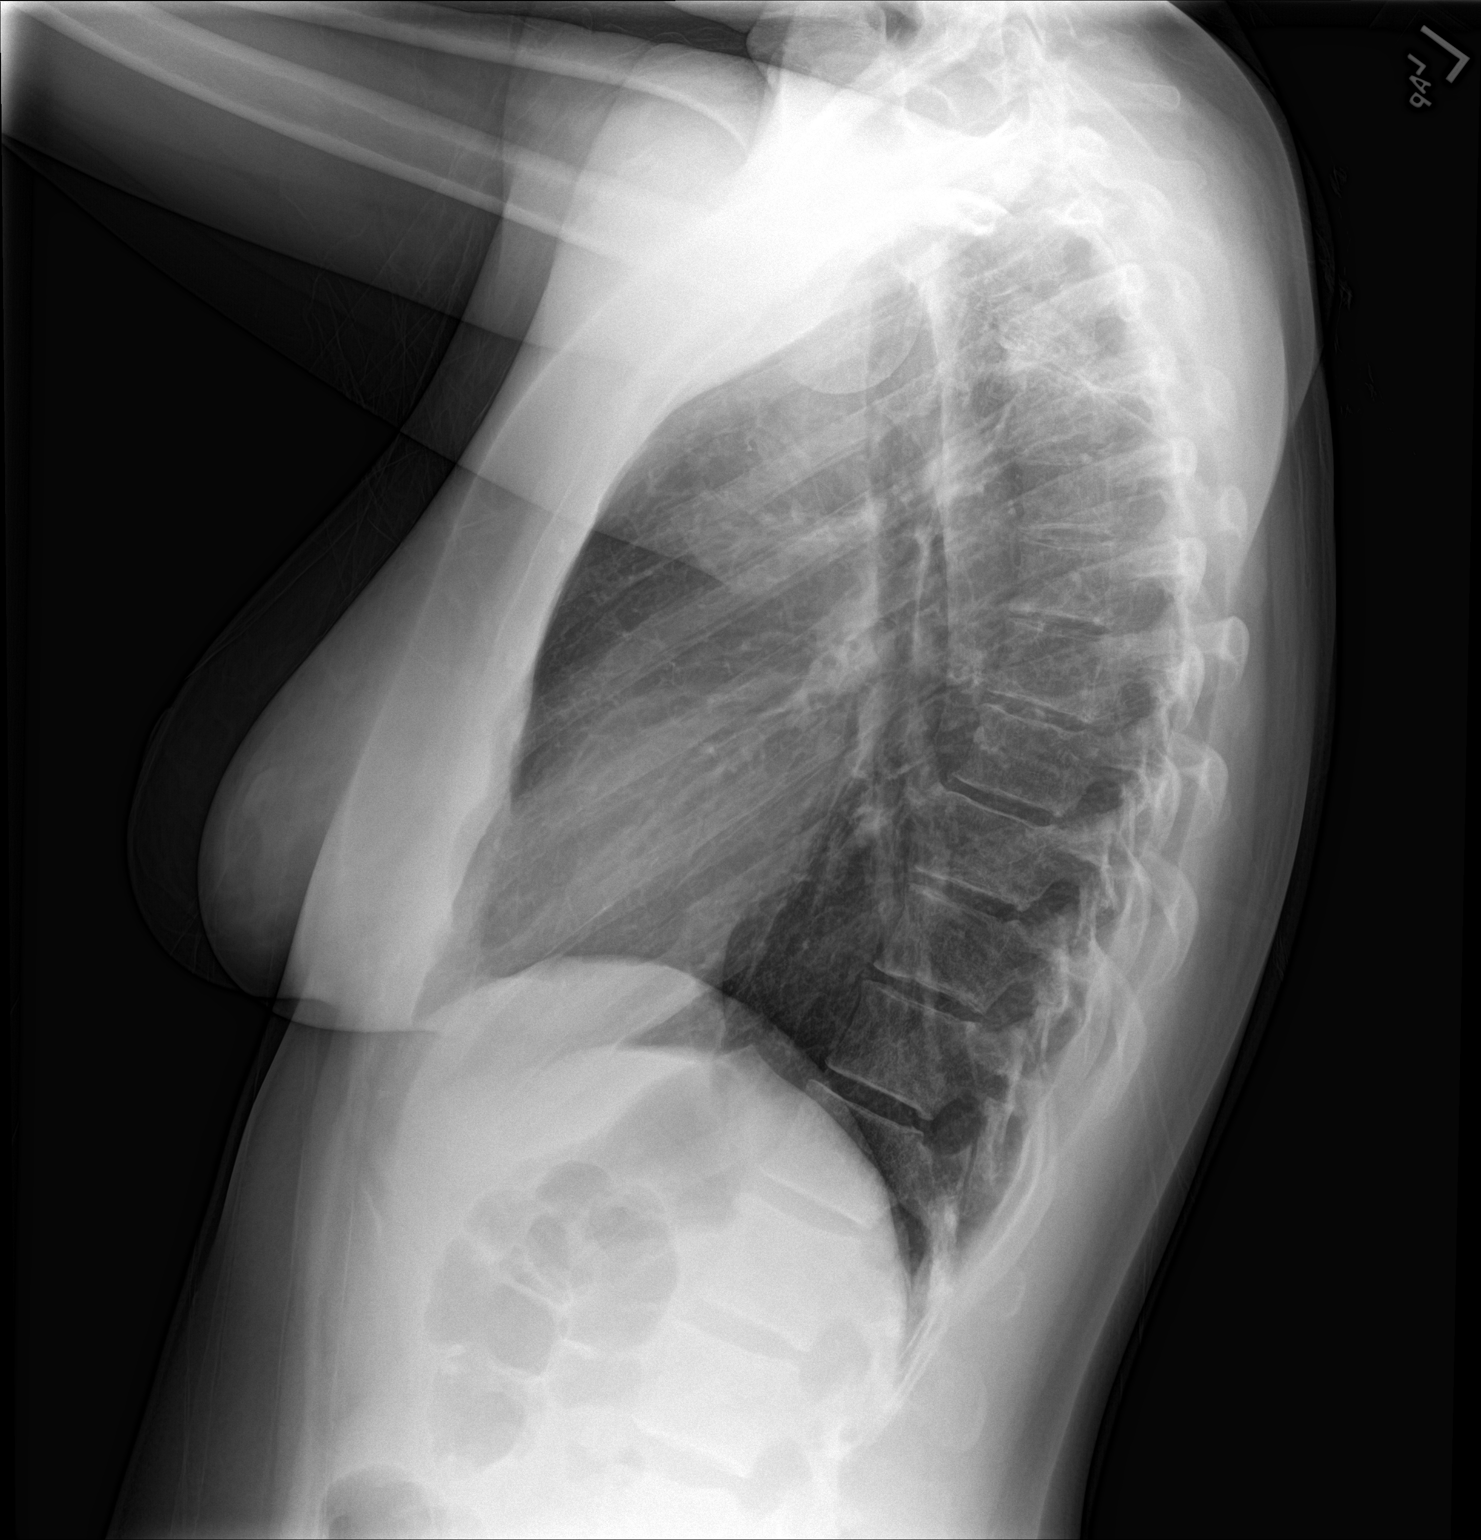

[2 of 2 positions shown; findings below may reference images not displayed]

FINDINGS: The heart size and mediastinal contours are within normal limits.
There is no focal infiltrate, pulmonary edema, or pleural effusion.
The visualized skeletal structures are unremarkable.
IMPRESSION: No active cardiopulmonary disease.

## 2017-08-29 NOTE — Progress Notes (Deleted)
08/30/2017 10:36 PM   Bridget Hughes 03/06/1986 [id number]  Referring provider: Idelle Crouch, MD [facility] [place], [place] [address]  No chief complaint on file.   HPI: Patient is a *** -year-old *** female who is referred to [place] by, ***, for recurrent urinary tract infections.  Patient states that she has had *** urinary tract infections over the last year.  Reviewing her records,  she has had *** .    Her symptoms with a urinary tract infection consist of ***.  She denies/endorses dysuria, gross hematuria, suprapubic pain, back pain, abdominal pain or flank pain associated with UTI's.    She has not had any recent fevers, chills, nausea or vomiting associated with UTI's.   She does/does not have a history of nephrolithiasis, GU surgery or GU trauma. ***  She is/is not sexually active.  She has/has not noted a correlation with her urinary tract infections and sexual intercourse.  ***   She does/does not engage in anal sex. ***  She is/ is not having anal to vaginal sex.*** She is/is not voiding before and after sex. ***     She is/is not postmenopausal. ***  She admits to/denies constipation and/or diarrhea. ***  She does/does not use tampons.  She does/does not engage in good perineal hygiene. She does/does not take tub baths. ***  She has/does not have incontinence.  She is using incontinence pads. ***  She is having/ not having pain with bladder filling.  ***  She has/not had any recent imaging studies.  ***  She is drinking *** of water daily.       PMH: Past Medical History:  Diagnosis Date  . Concussion   . Renal stone     Surgical History: No past surgical history on file.  Home Medications:  Allergies as of 08/30/2017      Reactions   Bee Venom Anaphylaxis   Shellfish Allergy Swelling   itching      Medication List        Accurate as of 08/29/17 10:36 PM. Always use your most recent med list.            brompheniramine-pseudoephedrine-DM 30-2-10 MG/5ML syrup Take 5 mLs by mouth 4 (four) times daily as needed.   citalopram 20 MG tablet Commonly known as:  CELEXA Take 20 mg by mouth daily.   etodolac 500 MG tablet Commonly known as:  LODINE Take 1 tablet (500 mg total) by mouth 2 (two) times daily.   oxyCODONE-acetaminophen 5-325 MG tablet Commonly known as:  ROXICET Take 1 tablet by mouth every 4 (four) hours as needed for severe pain.   oxyCODONE-acetaminophen 5-325 MG tablet Commonly known as:  ROXICET Take 1 tablet by mouth every 6 (six) hours as needed.   predniSONE 10 MG tablet Commonly known as:  DELTASONE Take a daily regimen of 6,5,4,3,2,1   SUMAtriptan 50 MG tablet Commonly known as:  IMITREX Take 50 mg by mouth as needed.   tamsulosin 0.4 MG Caps capsule Commonly known as:  FLOMAX Take 1 capsule (0.4 mg total) by mouth daily.   traZODone 50 MG tablet Commonly known as:  DESYREL Take 1 tablet by mouth at bedtime as needed.       Allergies:  Allergies  Allergen Reactions  . Bee Venom Anaphylaxis  . Shellfish Allergy Swelling    itching    Family History: No family history on file.  Social History:  reports that she has been  smoking.  she has never used smokeless tobacco. She reports that she does not drink alcohol. Her drug history is not on file.  ROS:                                        Physical Exam: There were no vitals taken for this visit.  Constitutional: Well nourished. Alert and oriented, No acute distress. HEENT: [place] AT, moist mucus membranes. Trachea midline, no masses. Cardiovascular: No clubbing, cyanosis, or edema. Respiratory: Normal respiratory effort, no increased work of breathing. GI: Abdomen is soft, non tender, non distended, no abdominal masses. Liver and spleen not palpable.  No hernias appreciated.  Stool sample for occult testing is not indicated.   GU: No CVA tenderness.  No bladder  fullness or masses.  Patient with circumcised/uncircumcised phallus. ***Foreskin easily retracted***  Urethral meatus is patent.  No penile discharge. No penile lesions or rashes. Scrotum without lesions, cysts, rashes and/or edema.  Testicles are located scrotally bilaterally. No masses are appreciated in the testicles. Left and right epididymis are normal. Rectal: Patient with  normal sphincter tone. Anus and perineum without scarring or rashes. No rectal masses are appreciated. Prostate is approximately *** grams, *** nodules are appreciated. Seminal vesicles are normal. Skin: No rashes, bruises or suspicious lesions. Lymph: No cervical or inguinal adenopathy. Neurologic: Grossly intact, no focal deficits, moving all 4 extremities. Psychiatric: Normal mood and affect.  Laboratory Data: Lab Results  Component Value Date   WBC 7.9 10/14/2016   HGB 14.3 10/14/2016   HCT 41.6 10/14/2016   MCV 93.7 10/14/2016   PLT 311 10/14/2016    Lab Results  Component Value Date   CREATININE 0.72 10/14/2016    No results found for: PSA  No results found for: TESTOSTERONE  No results found for: HGBA1C  No results found for: TSH  No results found for: CHOL, HDL, CHOLHDL, VLDL, LDLCALC  Lab Results  Component Value Date   AST 27 09/18/2014   Lab Results  Component Value Date   ALT 20 09/18/2014   No components found for: ALKALINEPHOPHATASE No components found for: BILIRUBINTOTAL  No results found for: ESTRADIOL  Urinalysis    Component Value Date/Time   COLORURINE YELLOW (A) 10/14/2016 1116   APPEARANCEUR HAZY (A) 10/14/2016 1116   APPEARANCEUR Cloudy 09/18/2014 1004   LABSPEC 1.021 10/14/2016 1116   LABSPEC 1.023 09/18/2014 1004   PHURINE 6.0 10/14/2016 1116   GLUCOSEU NEGATIVE 10/14/2016 1116   GLUCOSEU 50 mg/dL 09/18/2014 1004   HGBUR SMALL (A) 10/14/2016 1116   BILIRUBINUR NEGATIVE 10/14/2016 1116   BILIRUBINUR Negative 09/18/2014 1004   KETONESUR 20 (A) 10/14/2016 1116    PROTEINUR NEGATIVE 10/14/2016 1116   NITRITE NEGATIVE 10/14/2016 1116   LEUKOCYTESUR NEGATIVE 10/14/2016 1116   LEUKOCYTESUR Negative 09/18/2014 1004    I have reviewed the labs.   Pertinent Imaging: *** I have independently reviewed the films.    Assessment & Plan:  ***  1. Recurrent UTI's  - criteria for recurrent UTI has been met with 2 or more infections in 6 months or 3 or greater infections in one year   - patient is instructed to increase their water intake until the urine is pale yellow or clear (10 to 12 cups daily) ***  - patient is instructed to take probiotics (yogurt, oral pills or vaginal suppositories), take cranberry pills or drink the juice and Vitamin  C 1,000 mg daily to acidify the urine ***  - if using tampons, she should remove them prior to urinating and change them often ***  - avoid soaking in tubs and wipe front to back after urinating ***  - benefit from core strengthening exercises has been seen.  We can refer to PT if they desire ***  - advised them to have CATH UA's for urinalysis and culture to prevent skin, vaginal and/or rectal contamination of the specimen  - reviewed symptoms of UTI (fevers, chills, gross hematuria, mental status changes, dysuria, suprapubic pain, back pain and/or sudden worsening of urinary symptoms) and advised not to have urine checked or be treated for UTI if not experiencing symptoms  - discussed antibiotic stewardship with the patient - explained the risk of increasing risk of antibiotic resistance with continuous exposure to antibiotics, renal failure, hypoglycemia, C. Diff infection, allergic reactions, etc.                                                   No Follow-up on file.  These notes generated with voice recognition software. I apologize for typographical errors.  Zara Council, [facility] [address] [place], [place] [address] [phone]

## 2017-08-30 ENCOUNTER — Encounter: Payer: Self-pay | Admitting: Urology

## 2017-08-30 ENCOUNTER — Ambulatory Visit: Payer: BC Managed Care – PPO | Admitting: Urology

## 2017-11-17 ENCOUNTER — Other Ambulatory Visit: Payer: Self-pay

## 2017-11-17 ENCOUNTER — Encounter: Payer: Self-pay | Admitting: Emergency Medicine

## 2017-11-17 ENCOUNTER — Emergency Department
Admission: EM | Admit: 2017-11-17 | Discharge: 2017-11-17 | Disposition: A | Discharge status: Home / Self Care | Payer: BC Managed Care – PPO | Attending: Emergency Medicine | Admitting: Emergency Medicine

## 2017-11-17 DIAGNOSIS — F17200 Nicotine dependence, unspecified, uncomplicated: Secondary | ICD-10-CM | POA: Diagnosis not present

## 2017-11-17 DIAGNOSIS — R109 Unspecified abdominal pain: Secondary | ICD-10-CM | POA: Diagnosis present

## 2017-11-17 HISTORY — DX: Urinary tract infection, site not specified: N39.0

## 2017-11-17 LAB — BASIC METABOLIC PANEL
Anion gap: 11 (ref 5–15)
BUN: 11 mg/dL (ref 6–20)
CHLORIDE: 106 mmol/L (ref 101–111)
CO2: 19 mmol/L — AB (ref 22–32)
Calcium: 9.3 mg/dL (ref 8.9–10.3)
Creatinine, Ser: 0.83 mg/dL (ref 0.44–1.00)
GFR calc Af Amer: >60 mL/min (ref >60)
GFR calc non Af Amer: >60 mL/min (ref >60)
GLUCOSE: 111 mg/dL — AB (ref 65–99)
POTASSIUM: 3.3 mmol/L — AB (ref 3.5–5.1)
Sodium: 136 mmol/L (ref 135–145)

## 2017-11-17 LAB — CBC
HEMATOCRIT: 40.3 % (ref 35.0–47.0)
Hemoglobin: 13.9 g/dL (ref 12.0–16.0)
MCH: 32.9 pg (ref 26.0–34.0)
MCHC: 34.4 g/dL (ref 32.0–36.0)
MCV: 95.7 fL (ref 80.0–100.0)
PLATELETS: 329 10*3/uL (ref 150–440)
RBC: 4.21 MIL/uL (ref 3.80–5.20)
RDW: 13.2 % (ref 11.5–14.5)
WBC: 13.8 10*3/uL — ABNORMAL HIGH (ref 3.6–11.0)

## 2017-11-17 LAB — URINALYSIS, ROUTINE W REFLEX MICROSCOPIC
BACTERIA UA: NONE SEEN
BILIRUBIN URINE: NEGATIVE
Glucose, UA: NEGATIVE mg/dL
Ketones, ur: 20 mg/dL — AB
LEUKOCYTES UA: NEGATIVE
Nitrite: NEGATIVE
Protein, ur: 100 mg/dL — AB
RBC / HPF: >50 RBC/hpf — ABNORMAL HIGH (ref 0–5)
SPECIFIC GRAVITY, URINE: 1.024 (ref 1.005–1.030)
pH: 7 (ref 5.0–8.0)

## 2017-11-17 LAB — POCT PREGNANCY, URINE: Preg Test, Ur: NEGATIVE

## 2017-11-17 MED ORDER — SODIUM CHLORIDE 0.9 % IV BOLUS
1000.0000 mL | Freq: Once | INTRAVENOUS | Status: AC
  Administered 2017-11-17: 1000 mL via INTRAVENOUS

## 2017-11-17 MED ORDER — KETOROLAC TROMETHAMINE 30 MG/ML IJ SOLN
30.0000 mg | Freq: Once | INTRAMUSCULAR | Status: AC
  Administered 2017-11-17: 30 mg via INTRAVENOUS
  Filled 2017-11-17: qty 1

## 2017-11-17 NOTE — Discharge Instructions (Addendum)
Please seek medical attention for any high fevers, chest pain, shortness of breath, change in behavior, persistent vomiting, bloody stool or any other new or concerning symptoms.  

## 2017-11-17 NOTE — ED Notes (Signed)
AAOx3.  Skin warm and dry.  NAD 

## 2017-11-17 NOTE — ED Provider Notes (Signed)
[facility] Emergency Department Provider Note  ____________________________________________   I have reviewed the triage vital signs and the nursing notes.   HISTORY  Chief Complaint Recurrent UTI   History limited by: Not Limited   HPI Bridget Hughes is a 32 y.o. female who presents to the emergency department today because of concern for continued UTI and left sided flank pain. The patient states that she has been dealing with UTIs for the past month and a half. She has been on multiple courses of antibiotics, having been on the most recent one for two days. She states that her increased frequency and burning have improved but she has continued to have left sided pain. The pain is worse with movements. She denies any fevers. Denies any change in bowel movements but states they are loose.   Per medical record review patient has a history of recent antibiotic prescriptions for UTI  Past Medical History:  Diagnosis Date  . Concussion   . Renal stone   . UTI (urinary tract infection)     There are no active problems to display for this patient.   History reviewed. No pertinent surgical history.  Prior to Admission medications   Medication Sig Start Date End Date Taking? Authorizing Provider  brompheniramine-pseudoephedrine-DM 30-2-10 MG/5ML syrup Take 5 mLs by mouth 4 (four) times daily as needed. Patient not taking: Reported on 10/14/2016 07/24/15   Hagler, Jami L, PA-C  citalopram (CELEXA) 20 MG tablet Take 20 mg by mouth daily. 07/15/16 07/15/17  [provider]  etodolac (LODINE) 500 MG tablet Take 1 tablet (500 mg total) by mouth 2 (two) times daily. Patient not taking: Reported on 10/14/2016 05/23/15   Evon SlackGaines, Thomas C, PA-C  oxyCODONE-acetaminophen (ROXICET) 5-325 MG per tablet Take 1 tablet by mouth every 4 (four) hours as needed for severe pain. Patient not taking: Reported on 10/14/2016 12/20/14   Phineas SemenGoodman, Kendall Justo, MD  oxyCODONE-acetaminophen  (ROXICET) 5-325 MG per tablet Take 1 tablet by mouth every 6 (six) hours as needed. Patient not taking: Reported on 10/14/2016 12/26/14   Emily FilbertWilliams, Jonathan E, MD  predniSONE (DELTASONE) 10 MG tablet Take a daily regimen of 6,5,4,3,2,1 Patient not taking: Reported on 10/14/2016 07/24/15   Hagler, Jami L, PA-C  SUMAtriptan (IMITREX) 50 MG tablet Take 50 mg by mouth as needed. 08/17/16   [provider]  tamsulosin (FLOMAX) 0.4 MG CAPS capsule Take 1 capsule (0.4 mg total) by mouth daily. Patient not taking: Reported on 10/14/2016 12/20/14   Phineas SemenGoodman, Daeshaun Specht, MD  traZODone (DESYREL) 50 MG tablet Take 1 tablet by mouth at bedtime as needed. 09/23/16   [provider]    Allergies Bee venom and Shellfish allergy  History reviewed. No pertinent family history.  Social History Social History   Tobacco Use  . Smoking status: Current Every Day Smoker  . Smokeless tobacco: Never Used  Substance Use Topics  . Alcohol use: No  . Drug use: Not on file    Review of Systems Constitutional: No fever/chills Eyes: No visual changes. ENT: No sore throat. Cardiovascular: Denies chest pain. Respiratory: Denies shortness of breath. Gastrointestinal: Positive for left flank pain.  Genitourinary: Negative for dysuria. Musculoskeletal: Negative for back pain. Skin: Negative for rash. Neurological: Negative for headaches, focal weakness or numbness.  ____________________________________________   PHYSICAL EXAM:  VITAL SIGNS: ED Triage Vitals  Enc Vitals Group     BP 11/17/17 1418 109/79     Pulse Rate 11/17/17 1418 67     Resp  11/17/17 1418 16     Temp 11/17/17 1418 98.3 F (36.8 C)     Temp Source 11/17/17 1418 Oral     SpO2 11/17/17 1418 94 %     Weight 11/17/17 1418 130 lb (59 kg)     Height 11/17/17 1418 5\' 3"  (1.6 m)     Head Circumference --      Peak Flow --      Pain Score 11/17/17 1424 6    Constitutional: Alert and oriented.  Eyes: Conjunctivae are normal.  ENT       Head: Normocephalic and atraumatic.      Nose: No congestion/rhinnorhea.      Mouth/Throat: Mucous membranes are moist.      Neck: No stridor. Hematological/Lymphatic/Immunilogical: No cervical lymphadenopathy. Cardiovascular: Normal rate, regular rhythm.  No murmurs, rubs, or gallops.  Respiratory: Normal respiratory effort without tachypnea nor retractions. Breath sounds are clear and equal bilaterally. No wheezes/rales/rhonchi. Gastrointestinal: Soft and non tender. No rebound. No guarding.  Genitourinary: Deferred Musculoskeletal: Normal range of motion in all extremities. No lower extremity edema. Neurologic:  Normal speech and language. No gross focal neurologic deficits are appreciated.  Skin:  Skin is warm, dry and intact. No rash noted. Psychiatric: Mood and affect are normal. Speech and behavior are normal. Patient exhibits appropriate insight and judgment.  ____________________________________________    LABS (pertinent positives/negatives)  Upreg negative BMP na 136, k 3.3, glu 111, cr 0.83 CBC wbc 13.8, hgb 13.9, plt 329 UA hazy, large urine dipstick, rbc >50, wbc 0-5, none seen bacteria, nitrite and leukocyte negative  ____________________________________________   EKG  None  ____________________________________________    RADIOLOGY  None  ____________________________________________   PROCEDURES  Procedures  ____________________________________________   INITIAL IMPRESSION / ASSESSMENT AND PLAN / ED COURSE  Pertinent labs & imaging results that were available during my care of the patient were reviewed by me and considered in my medical decision making (see chart for details).   Patient presented to the emergency department today because of concerns for left flank pain and continued urinary tract infection.  Differential however would also include zoster, muscle skeletal pain, intestinal disorder amongst other etiologies.  Patient's urine without  concerning signs for infection.  She has however been on antibiotics her urine culture will be sent.  In addition patient had more pain with movement.  At this point I think musculoskeletal cause likely.  She did feel better after IV fluids and Toradol.  Discussed importance of continued follow-up with patient.  ____________________________________________   FINAL CLINICAL IMPRESSION(S) / ED DIAGNOSES  Final diagnoses:  Left flank pain     Note: This dictation was prepared with Dragon dictation. Any transcriptional errors that result from this process are unintentional     Phineas Semen, MD 11/17/17 [phone]

## 2017-11-17 NOTE — ED Triage Notes (Signed)
Pt reports that she has had recurrent UTI's she is on her third round of antibiotics, she reports that today she began not feeling well and getting really shaky.

## 2017-11-19 LAB — URINE CULTURE

## 2019-03-04 ENCOUNTER — Emergency Department: Payer: BC Managed Care – PPO

## 2019-03-04 ENCOUNTER — Other Ambulatory Visit: Payer: Self-pay

## 2019-03-04 ENCOUNTER — Encounter: Payer: Self-pay | Admitting: Emergency Medicine

## 2019-03-04 ENCOUNTER — Emergency Department
Admission: EM | Admit: 2019-03-04 | Discharge: 2019-03-04 | Disposition: A | Discharge status: Home / Self Care | Payer: BC Managed Care – PPO | Attending: Emergency Medicine | Admitting: Emergency Medicine

## 2019-03-04 DIAGNOSIS — F17200 Nicotine dependence, unspecified, uncomplicated: Secondary | ICD-10-CM | POA: Insufficient documentation

## 2019-03-04 DIAGNOSIS — Z79899 Other long term (current) drug therapy: Secondary | ICD-10-CM | POA: Insufficient documentation

## 2019-03-04 DIAGNOSIS — O209 Hemorrhage in early pregnancy, unspecified: Secondary | ICD-10-CM

## 2019-03-04 DIAGNOSIS — Z3A01 Less than 8 weeks gestation of pregnancy: Secondary | ICD-10-CM | POA: Diagnosis not present

## 2019-03-04 DIAGNOSIS — O3680X0 Pregnancy with inconclusive fetal viability, not applicable or unspecified: Secondary | ICD-10-CM

## 2019-03-04 DIAGNOSIS — N939 Abnormal uterine and vaginal bleeding, unspecified: Secondary | ICD-10-CM

## 2019-03-04 LAB — URINALYSIS, ROUTINE W REFLEX MICROSCOPIC
Bacteria, UA: NONE SEEN
Bilirubin Urine: NEGATIVE
Glucose, UA: NEGATIVE mg/dL
Ketones, ur: 5 mg/dL — AB
Leukocytes,Ua: NEGATIVE
Nitrite: NEGATIVE
Protein, ur: NEGATIVE mg/dL
RBC / HPF: >50 RBC/hpf — ABNORMAL HIGH (ref 0–5)
Specific Gravity, Urine: 1.018 (ref 1.005–1.030)
pH: 6 (ref 5.0–8.0)

## 2019-03-04 LAB — CBC WITH DIFFERENTIAL/PLATELET
Abs Immature Granulocytes: 0.03 10*3/uL (ref 0.00–0.07)
Basophils Absolute: 0.1 10*3/uL (ref 0.0–0.1)
Basophils Relative: 1 %
Eosinophils Absolute: 0.8 10*3/uL — ABNORMAL HIGH (ref 0.0–0.5)
Eosinophils Relative: 7 %
HCT: 43.7 % (ref 36.0–46.0)
Hemoglobin: 14.7 g/dL (ref 12.0–15.0)
Immature Granulocytes: 0 %
Lymphocytes Relative: 29 %
Lymphs Abs: 3 10*3/uL (ref 0.7–4.0)
MCH: 32.5 pg (ref 26.0–34.0)
MCHC: 33.6 g/dL (ref 30.0–36.0)
MCV: 96.5 fL (ref 80.0–100.0)
Monocytes Absolute: 0.8 10*3/uL (ref 0.1–1.0)
Monocytes Relative: 8 %
Neutro Abs: 5.6 10*3/uL (ref 1.7–7.7)
Neutrophils Relative %: 55 %
Platelets: 326 10*3/uL (ref 150–400)
RBC: 4.53 MIL/uL (ref 3.87–5.11)
RDW: 12.6 % (ref 11.5–15.5)
WBC: 10.3 10*3/uL (ref 4.0–10.5)
nRBC: 0 % (ref 0.0–0.2)

## 2019-03-04 LAB — BASIC METABOLIC PANEL
Anion gap: 11 (ref 5–15)
BUN: 9 mg/dL (ref 6–20)
CO2: 23 mmol/L (ref 22–32)
Calcium: 9.2 mg/dL (ref 8.9–10.3)
Chloride: 104 mmol/L (ref 98–111)
Creatinine, Ser: 0.68 mg/dL (ref 0.44–1.00)
GFR calc Af Amer: >60 mL/min (ref >60)
GFR calc non Af Amer: >60 mL/min (ref >60)
Glucose, Bld: 90 mg/dL (ref 70–99)
Potassium: 4.1 mmol/L (ref 3.5–5.1)
Sodium: 138 mmol/L (ref 135–145)

## 2019-03-04 LAB — ABO/RH: ABO/RH(D): A POS

## 2019-03-04 LAB — HCG, QUANTITATIVE, PREGNANCY: hCG, Beta Chain, Quant, S: 179 m[IU]/mL — ABNORMAL HIGH (ref <5)

## 2019-03-04 NOTE — ED Provider Notes (Signed)
[facility] Emergency Department Provider Note  ____________________________________________  Time seen: Approximately 11:15 AM  I have reviewed the triage vital signs and the nursing notes.   HISTORY  Chief Complaint Vaginal Bleeding    HPI Bridget Hughes is a 33 y.o. female with a history of UTI and kidney stone who comes the ED complaining of vaginal bleeding that started last night.  No abnormal discharge, dysuria frequency urgency.  No abdominal pain fevers chills or pain complaints.  Pain started after spotting and is currently increased but still lighter than a period.  She is approximately [redacted] weeks pregnant by dates.  Review of electronic medical record shows that she had an hCG level of 72 on February 21, 2019.      Past Medical History:  Diagnosis Date  . Concussion   . Renal stone   . UTI (urinary tract infection)      There are no active problems to display for this patient.    History reviewed. No pertinent surgical history.   Prior to Admission medications   Medication Sig Start Date End Date Taking? Authorizing Provider  citalopram (CELEXA) 20 MG tablet Take 20 mg by mouth daily. 07/15/16 07/15/17  [provider]  SUMAtriptan (IMITREX) 50 MG tablet Take 50 mg by mouth as needed. 08/17/16   [provider]  traZODone (DESYREL) 50 MG tablet Take 1 tablet by mouth at bedtime as needed. 09/23/16   [provider]     Allergies Bee venom and Shellfish allergy   No family history on file.  Social History Social History   Tobacco Use  . Smoking status: Current Every Day Smoker  . Smokeless tobacco: Never Used  Substance Use Topics  . Alcohol use: No  . Drug use: Not on file    Review of Systems  Constitutional:   No fever or chills.  ENT:   No sore throat. No rhinorrhea. Cardiovascular:   No chest pain or syncope. Respiratory:   No dyspnea or cough. Gastrointestinal:   Negative for abdominal pain,  vomiting and diarrhea.  Musculoskeletal:   Negative for focal pain or swelling All other systems reviewed and are negative except as documented above in ROS and HPI.  ____________________________________________   PHYSICAL EXAM:  VITAL SIGNS: ED Triage Vitals  Enc Vitals Group     BP 03/04/19 0915 114/85     Pulse Rate 03/04/19 0915 78     Resp 03/04/19 0915 16     Temp 03/04/19 0916 98 F (36.7 C)     Temp Source 03/04/19 0915 Oral     SpO2 03/04/19 0915 97 %     Weight 03/04/19 0915 147 lb (66.7 kg)     Height 03/04/19 0915 5\' 4"  (1.626 m)     Head Circumference --      Peak Flow --      Pain Score 03/04/19 0915 0     Pain Loc --      Pain Edu? --      Excl. in GC? --     Vital signs reviewed, nursing assessments reviewed.   Constitutional:   Alert and oriented. Non-toxic appearance. Eyes:   Conjunctivae are normal. EOMI. PERRL. ENT      Head:   Normocephalic and atraumatic.      Nose:   Wearing a mask.      Mouth/Throat:   Wearing a mask.      Neck:   No meningismus. Full ROM. Hematological/Lymphatic/Immunilogical:   No  cervical lymphadenopathy. Cardiovascular:   RRR. Symmetric bilateral radial and DP pulses.  No murmurs. Cap refill less than 2 seconds. Respiratory:   Normal respiratory effort without tachypnea/retractions. Breath sounds are clear and equal bilaterally. No wheezes/rales/rhonchi. Gastrointestinal:   Soft and nontender. Non distended. There is no CVA tenderness.  No rebound, rigidity, or guarding. Musculoskeletal:   Normal range of motion in all extremities. No joint effusions.  No lower extremity tenderness.  No edema. Neurologic:   Normal speech and language.  Motor grossly intact. No acute focal neurologic deficits are appreciated.  Skin:    Skin is warm, dry and intact. No rash noted.  No petechiae, purpura, or bullae.  ____________________________________________    LABS (pertinent positives/negatives) (all labs ordered are listed, but only  abnormal results are displayed) Labs Reviewed  HCG, QUANTITATIVE, PREGNANCY - Abnormal; Notable for the following components:      Result Value   hCG, Beta Chain, Quant, S 179 (*)    All other components within normal limits  URINALYSIS, ROUTINE W REFLEX MICROSCOPIC - Abnormal; Notable for the following components:   Color, Urine YELLOW (*)    APPearance HAZY (*)    Hgb urine dipstick LARGE (*)    Ketones, ur 5 (*)    RBC / HPF >50 (*)    All other components within normal limits  CBC WITH DIFFERENTIAL/PLATELET - Abnormal; Notable for the following components:   Eosinophils Absolute 0.8 (*)    All other components within normal limits  BASIC METABOLIC PANEL  POC URINE PREG, ED  ABO/RH   ____________________________________________   EKG    ____________________________________________    RADIOLOGY  US Ob Less Than 14 Weeks With Ob Transvaginal  Result Date: 03/04/2019 CLINICAL DATA:  Vaginal bleeding for 1 day.  Beta HCG 179. EXAM: OBSTETRIC <14 WK [place] AND TRANSVAGINAL OB US TECHNIQUE: Both transabdominal and transvaginal ultrasound examinations were performed for complete evaluation of the gestation as well as the maternal uterus, adnexal regions, and pelvic cul-de-sac. Transvaginal technique was performed to assess early pregnancy. COMPARISON:  None. FINDINGS: Intrauterine gestational sac: None Yolk sac:  Not visualized Embryo:  Not visualized Maternal uterus/adnexae: Uterus measures 6.7 x 4.7 x 5.3 cm. Endometrium is upper normal in thickness at 17 mm. No mass or fluid is appreciated within the endometrial canal. Bilateral ovaries appear normal. No mass or significant free fluid in either adnexal region. IMPRESSION: 1. No intrauterine pregnancy identified.  Uterus appears normal. 2. No ectopic pregnancy identified. No adnexal mass or free fluid. Recommend follow-up with serial beta HCG levels, and obstetric ultrasound as needed, to exclude early occult ectopic pregnancy.  Electronically Signed   By: Franki Cabot M.D.   On: 03/04/2019 10:47    ____________________________________________   PROCEDURES Procedures  ____________________________________________  DIFFERENTIAL DIAGNOSIS   Threatened miscarriage, ectopic pregnancy  CLINICAL IMPRESSION / ASSESSMENT AND PLAN / ED COURSE  Medications ordered in the ED: Medications - No data to display  Pertinent labs & imaging results that were available during my care of the patient were reviewed by me and considered in my medical decision making (see chart for details).  Bridget Hughes was evaluated in Emergency Department on 03/04/2019 for the symptoms described in the history of present illness. She was evaluated in the context of the global COVID-19 pandemic, which necessitated consideration that the patient might be at risk for infection with the SARS-CoV-2 virus that causes COVID-19. Institutional protocols and algorithms that pertain to the evaluation of patients at risk for  COVID-19 are in a state of rapid change based on information released by regulatory bodies including the CDC and federal and state organizations. These policies and algorithms were followed during the patient's care in the ED.   Patient presents with first trimester vaginal bleeding.  Vital signs are normal, exam is reassuring.  No significant pain, nontoxic.  Clinical Course as of Mar 03 1114  Sun Mar 04, 2019  1100 Ultrasound negative for ectopic but unable to definitively visualize location of pregnancy.  hCG is 179, Rh+.  No RhoGam needed, likely completed miscarriage versus occult ectopic.  Hemodynamically stable, symptoms are mild, will refer to obstetrics follow-up in 2 to 3 days for serial hCG monitoring.   [PS]    Clinical Course User Index [PS] Sharman CheekStafford, Jeda Pardue, MD    ----------------------------------------- 11:17 AM on 03/04/2019 ----------------------------------------- Patient counseled on findings, will follow up  with her doctor for serial hCG monitoring and ensuring that she does not have a occult ectopic.      ____________________________________________   FINAL CLINICAL IMPRESSION(S) / ED DIAGNOSES    Final diagnoses:  First trimester bleeding  Pregnancy of unknown anatomic location     ED Discharge Orders    None      Portions of this note were generated with dragon dictation software. Dictation errors may occur despite best attempts at proofreading.   Sharman CheekStafford, Joseline Mccampbell, MD 03/04/19 1118

## 2019-03-04 NOTE — Discharge Instructions (Addendum)
Your ultrasound today did not visualize the pregnancy and was unable to identify its location.  Your HCG level today was 179.  Your blood type is A+.    It is most likely that you are having a miscarriage.  However, you should follow up with primary care or obstetrics for repeat HCG testing in 2-3 days to ensure that you do not have an ectopic pregnancy (which could become dangerous) that is too small to be seen.

## 2019-03-04 NOTE — ED Notes (Signed)
Pt wanting to leave - had her come back in room to sign which she did then left. Stated her sister's funeral was in 20 mins and rushed out with no vital sign recheck

## 2019-03-04 NOTE — ED Triage Notes (Signed)
Pt to ED via POV c/o vaginal bleeding. Pt states that she is approximately [redacted] weeks pregnant. Pt states that the bleeding started last night and was light, this morning bleeding has been heavier and is a darker color. Pt is in NAD at this time.

## 2019-08-19 ENCOUNTER — Ambulatory Visit: Payer: BC Managed Care – PPO | Attending: Internal Medicine

## 2019-08-19 DIAGNOSIS — Z23 Encounter for immunization: Secondary | ICD-10-CM

## 2019-08-19 NOTE — Progress Notes (Signed)
   Covid-19 Vaccination Clinic  Name:  Angelis Gates    MRN: [id number] DOB: 01/24/1986  08/19/2019  Ms. Botkins was observed post Covid-19 immunization for 15 minutes without incident. She was provided with Vaccine Information Sheet and instruction to access the V-Safe system.   Ms. Grosz was instructed to call 911 with any severe reactions post vaccine: Marland Kitchen Difficulty breathing  . Swelling of face and throat  . A fast heartbeat  . A bad rash all over body  . Dizziness and weakness   Immunizations Administered    Name Date Dose VIS Date Route   Pfizer COVID-19 Vaccine 08/19/2019  9:15 AM 0.3 mL 05/25/2019 Intramuscular   Manufacturer: [employer], [employer]   Lot: [id number]   NDC: [address]-4

## 2019-09-10 ENCOUNTER — Ambulatory Visit: Payer: BC Managed Care – PPO | Attending: Internal Medicine

## 2019-09-10 DIAGNOSIS — Z23 Encounter for immunization: Secondary | ICD-10-CM

## 2019-09-10 NOTE — Progress Notes (Signed)
   Covid-19 Vaccination Clinic  Name:  Helen Winterhalter    MRN: [id number] DOB: 1986/06/09  09/10/2019  Ms. Ormand was observed post Covid-19 immunization for 15 minutes without incident. She was provided with Vaccine Information Sheet and instruction to access the V-Safe system.   Ms. Astle was instructed to call 911 with any severe reactions post vaccine: Marland Kitchen Difficulty breathing  . Swelling of face and throat  . A fast heartbeat  . A bad rash all over body  . Dizziness and weakness   Immunizations Administered    Name Date Dose VIS Date Route   Pfizer COVID-19 Vaccine 09/10/2019  8:43 AM 0.3 mL 05/25/2019 Intramuscular   Manufacturer: [employer], [employer]   Lot: [id number]   NDC: [address]-0

## 2021-03-13 IMAGING — US US OB < 14 WEEKS - US OB TV
1 series · 14 of 28 positions shown · non-contrast
Comparison: None.

CLINICAL DATA: Vaginal bleeding for 1 day.  Beta HCG 179.

EXAM:
OBSTETRIC <14 WK US AND TRANSVAGINAL OB US
TECHNIQUE: Both transabdominal and transvaginal ultrasound examinations were
performed for complete evaluation of the gestation as well as the
maternal uterus, adnexal regions, and pelvic cul-de-sac.
Transvaginal technique was performed to assess early pregnancy.

[Series 1: us ob < 14 weeks - us ob tv · 14 of 50 slices shown]
[im 2/50]
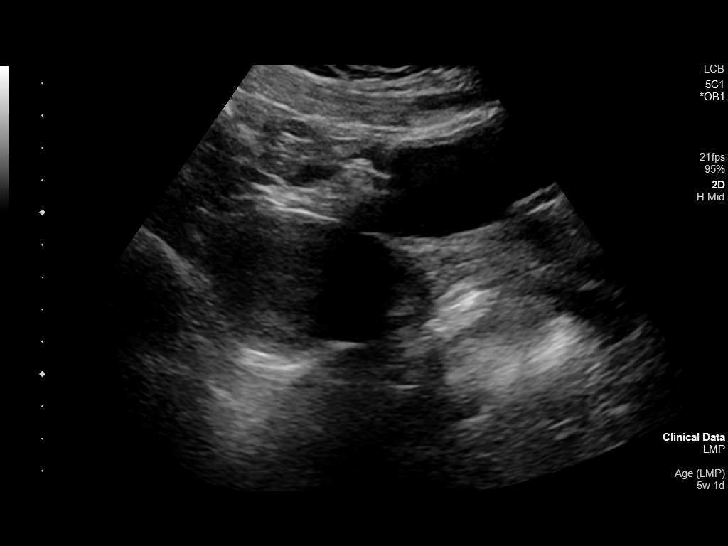
[im 6/50]
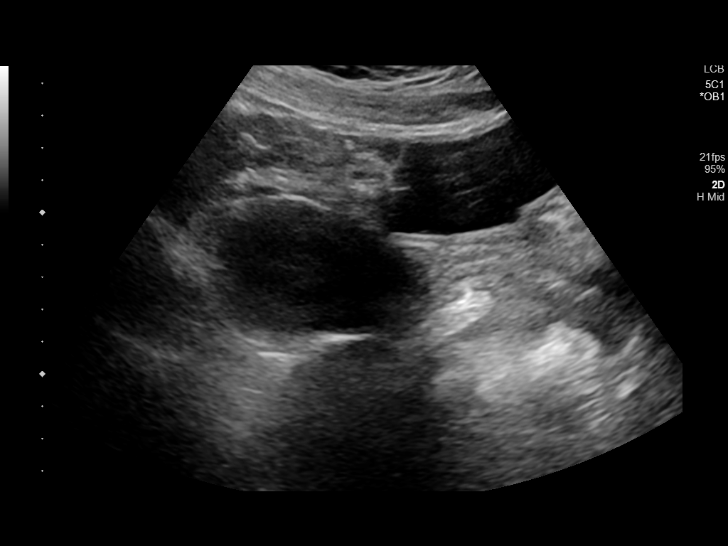
[im 10/50]
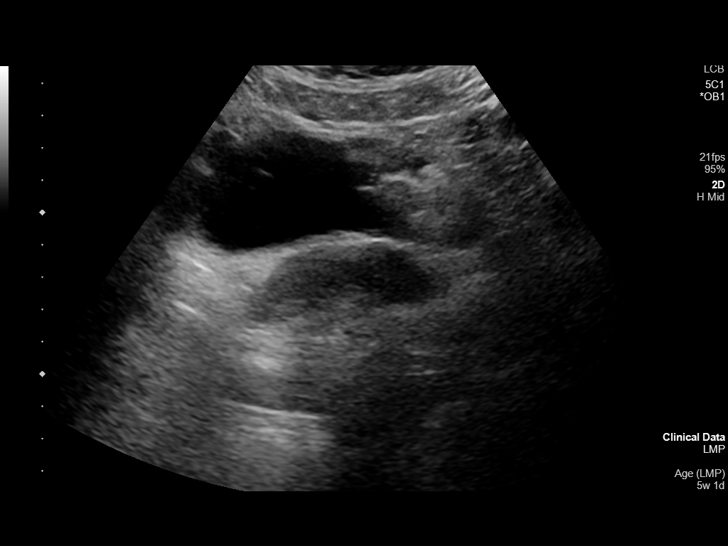
[im 13/50]
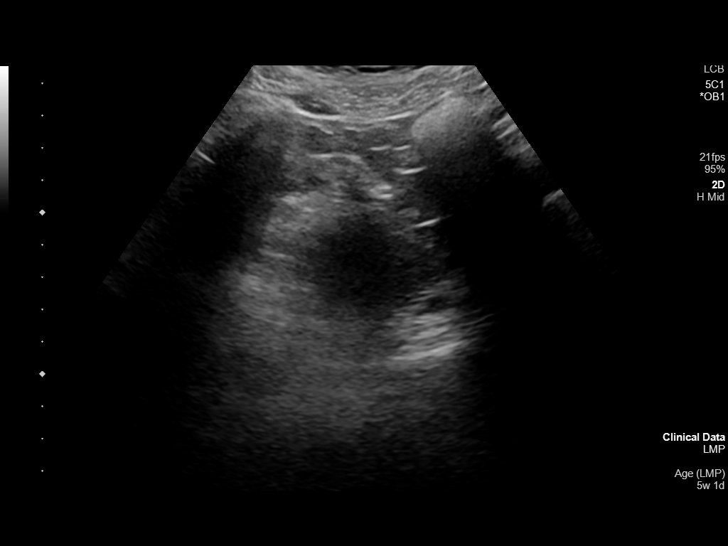
[im 17/50]
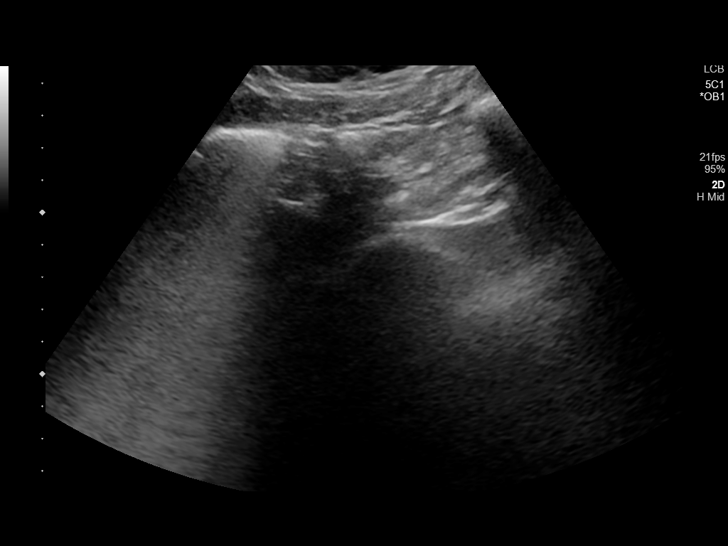
[im 20/50]
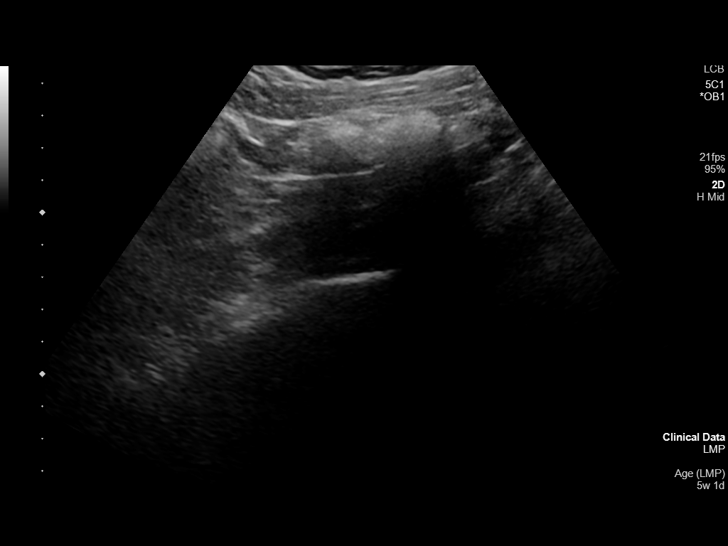
[im 24/50]
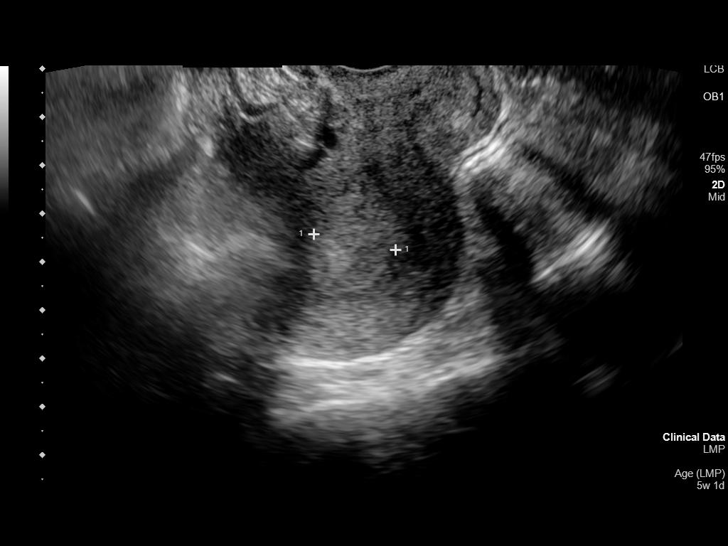
[im 28/50]
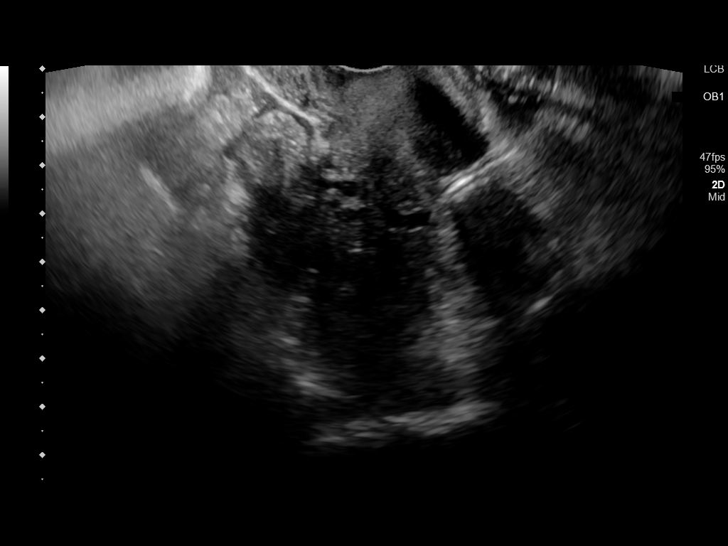
[im 31/50]
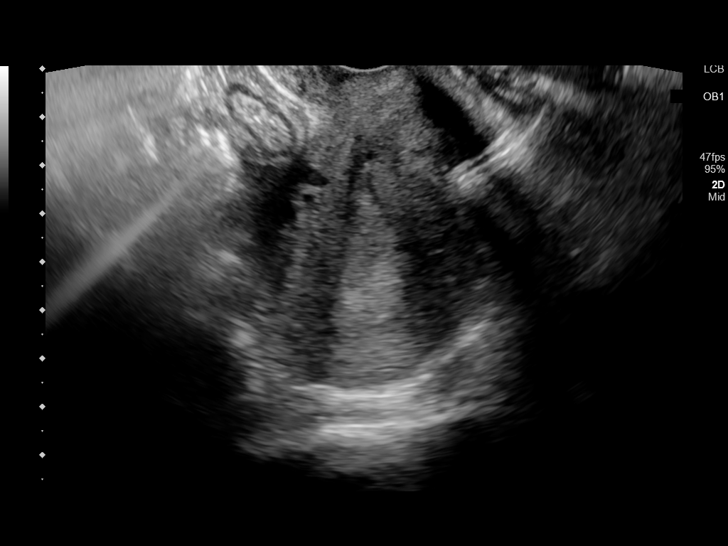
[im 35/50]
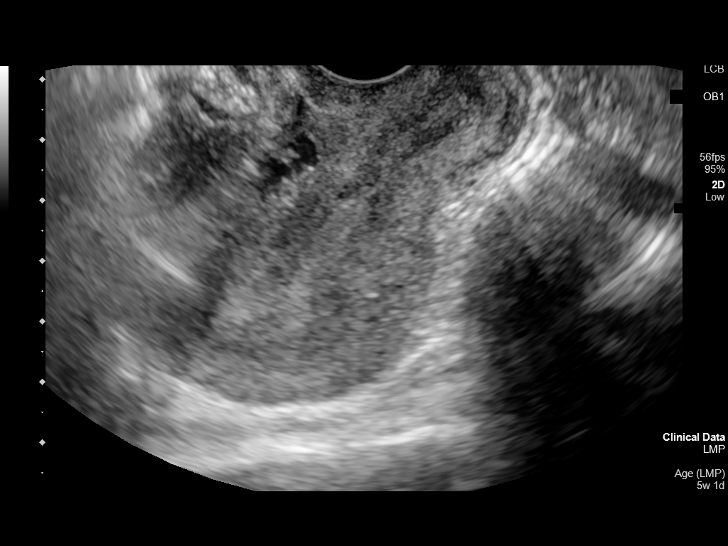
[im 39/50]
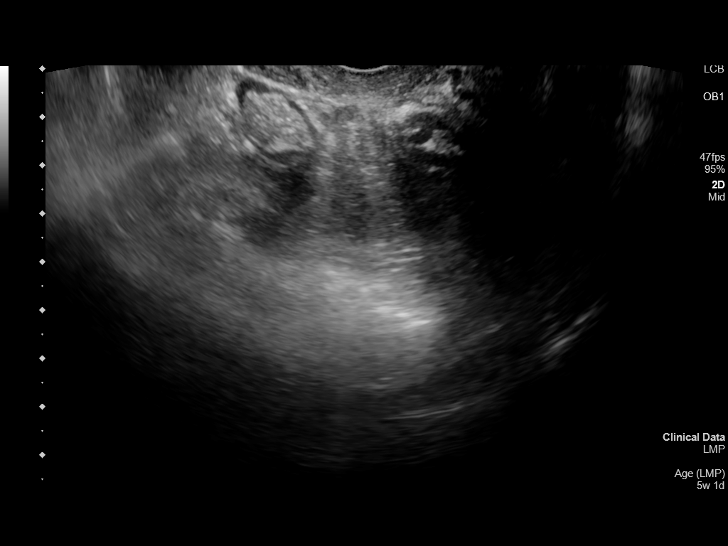
[im 42/50]
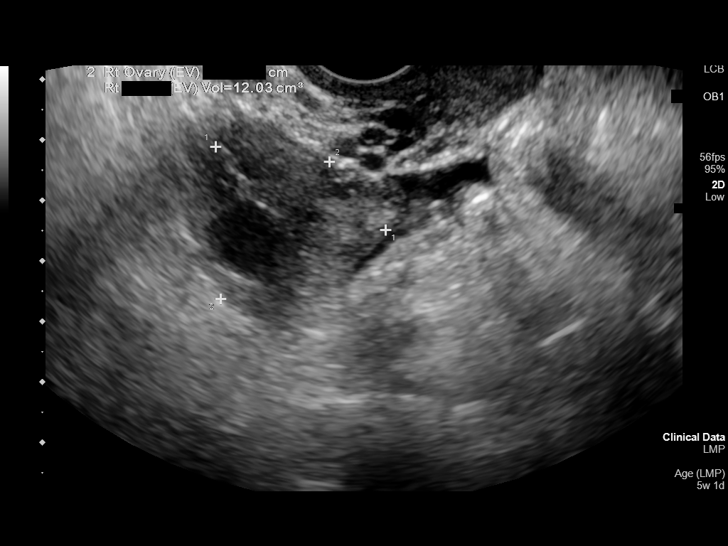
[im 46/50]
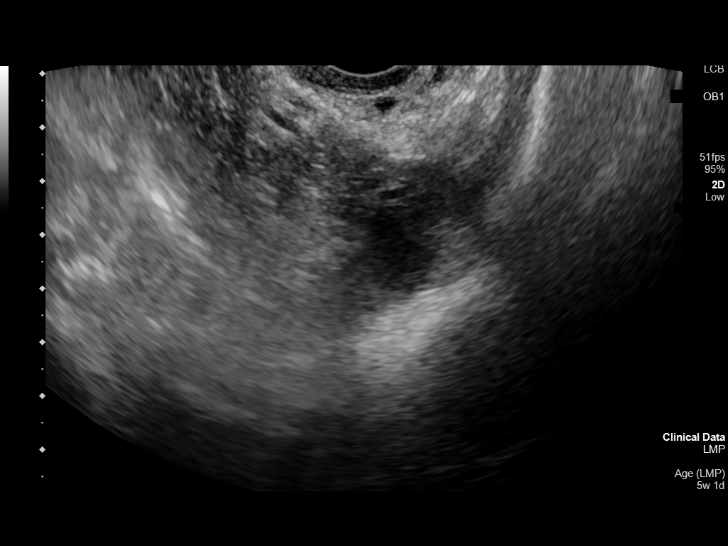
[im 50/50]
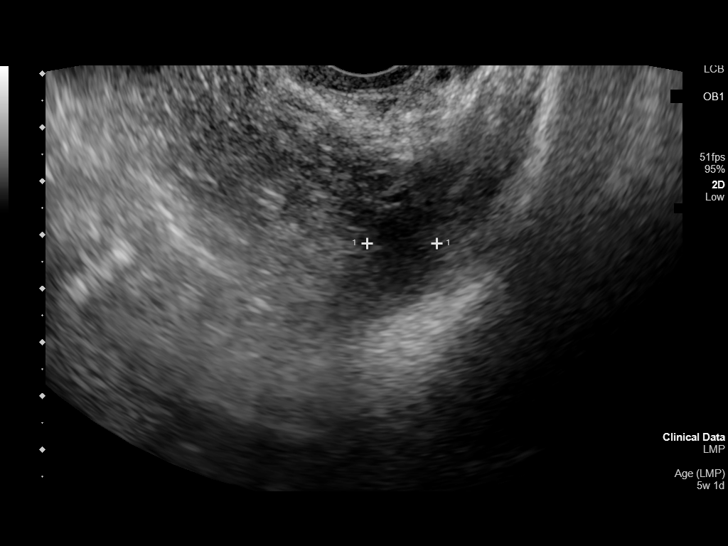

[14 of 28 positions shown; findings below may reference images not displayed]

FINDINGS: Intrauterine gestational sac: None

Yolk sac:  Not visualized

Embryo:  Not visualized

Maternal uterus/adnexae: Uterus measures 6.7 x 4.7 x 5.3 cm.
Endometrium is upper normal in thickness at 17 mm. No mass or fluid
is appreciated within the endometrial canal. Bilateral ovaries
appear normal. No mass or significant free fluid in either adnexal
region.
IMPRESSION: 1. No intrauterine pregnancy identified.  Uterus appears normal.
2. No ectopic pregnancy identified. No adnexal mass or free fluid.
Recommend follow-up with serial beta HCG levels, and obstetric
ultrasound as needed, to exclude early occult ectopic pregnancy.

## 2022-06-14 NOTE — L&D Delivery Note (Signed)
Delivery Note  Date of delivery: 01/18/2023 Estimated Date of Delivery: 01/21/23 Patient's last menstrual period was 04/16/2022 (exact date). EGA: [redacted]w[redacted]d  Delivery Note At 8:48 PM a viable female was delivered via Vaginal, Spontaneous (Presentation: Right Occiput Anterior).  APGAR: 8, 9; weight pending.  Placenta status: Spontaneous, Intact.  Cord: 3 vessels with the following complications: None.    First Stage: Labor onset: day before Augmentation : AROM, Pitocin Analgesia /Anesthesia intrapartum: Epidural AROM at 0645  Bridget Hughes presented to L&D with active labor. She was augmented with AROM and pitocin. Epidural placed for pain relief.   Second Stage: Complete dilation at 1635 Onset of pushing at 1740 FHR second stage Cat I Delivery at 2048 on 01/18/2023  She progressed to complete and had a spontaneous vaginal birth of a live female over an intact perineum. The fetal head was delivered in OA position with restitution to ROA. No nuchal cord. Anterior then posterior shoulders delivered spontaneously. Baby placed on mom's abdomen and attended to by transition RN. Cord clamped and cut after 1+ min by FOB.   Third Stage: Placenta delivered intact with 3VC at 2054 Placenta disposition: routine disposal  Uterine tone firm / bleeding min IV pitocin given for hemorrhage prophylaxis  Anesthesia: Epidural Episiotomy: None Lacerations: 2nd degree Suture Repair: 2.0 vicryl Est. Blood Loss (mL): 100  Complications: none  Mom to postpartum.  Baby to Couplet care / Skin to Skin.  Newborn: Birth Weight: pending  Apgar Scores: 8, 9 Feeding planned: breastfeeding   Cyril Mourning, CNM 01/18/2023 9:12 PM

## 2022-07-13 DIAGNOSIS — Z3403 Encounter for supervision of normal first pregnancy, third trimester: Secondary | ICD-10-CM | POA: Insufficient documentation

## 2022-07-16 DIAGNOSIS — O99210 Obesity complicating pregnancy, unspecified trimester: Secondary | ICD-10-CM | POA: Insufficient documentation

## 2022-07-16 DIAGNOSIS — Z8659 Personal history of other mental and behavioral disorders: Secondary | ICD-10-CM | POA: Insufficient documentation

## 2022-07-16 LAB — OB RESULTS CONSOLE HEPATITIS B SURFACE ANTIGEN: Hepatitis B Surface Ag: NEGATIVE

## 2022-07-16 LAB — OB RESULTS CONSOLE GC/CHLAMYDIA
Chlamydia: NEGATIVE
Neisseria Gonorrhea: NEGATIVE

## 2022-07-16 LAB — HEPATITIS C ANTIBODY: HCV Ab: NEGATIVE

## 2022-07-16 LAB — OB RESULTS CONSOLE RPR: RPR: NONREACTIVE

## 2022-07-16 LAB — OB RESULTS CONSOLE HIV ANTIBODY (ROUTINE TESTING): HIV: NONREACTIVE

## 2022-07-16 LAB — OB RESULTS CONSOLE RUBELLA ANTIBODY, IGM: Rubella: NON-IMMUNE/NOT IMMUNE

## 2022-07-16 LAB — OB RESULTS CONSOLE VARICELLA ZOSTER ANTIBODY, IGG: Varicella: IMMUNE

## 2022-12-28 LAB — OB RESULTS CONSOLE RPR: RPR: NONREACTIVE

## 2022-12-28 LAB — OB RESULTS CONSOLE HIV ANTIBODY (ROUTINE TESTING): HIV: NONREACTIVE

## 2022-12-28 LAB — OB RESULTS CONSOLE GBS: GBS: NEGATIVE

## 2022-12-28 LAB — OB RESULTS CONSOLE GC/CHLAMYDIA
Chlamydia: NEGATIVE
Neisseria Gonorrhea: NEGATIVE

## 2023-01-17 ENCOUNTER — Encounter: Payer: Self-pay | Admitting: Obstetrics and Gynecology

## 2023-01-17 ENCOUNTER — Other Ambulatory Visit: Payer: Self-pay

## 2023-01-17 ENCOUNTER — Inpatient Hospital Stay
Admission: EM | Admit: 2023-01-17 | Discharge: 2023-01-20 | DRG: 806 | Disposition: A | Discharge status: Home / Self Care | Payer: BC Managed Care – PPO | Attending: Obstetrics | Admitting: Obstetrics

## 2023-01-17 DIAGNOSIS — O9081 Anemia of the puerperium: Secondary | ICD-10-CM | POA: Diagnosis not present

## 2023-01-17 DIAGNOSIS — O99334 Smoking (tobacco) complicating childbirth: Secondary | ICD-10-CM | POA: Diagnosis present

## 2023-01-17 DIAGNOSIS — Z2839 Encounter for supervision of normal pregnancy, unspecified, unspecified trimester: Secondary | ICD-10-CM

## 2023-01-17 DIAGNOSIS — O479 False labor, unspecified: Principal | ICD-10-CM | POA: Diagnosis present

## 2023-01-17 DIAGNOSIS — O99214 Obesity complicating childbirth: Principal | ICD-10-CM | POA: Diagnosis present

## 2023-01-17 DIAGNOSIS — O26893 Other specified pregnancy related conditions, third trimester: Secondary | ICD-10-CM | POA: Diagnosis present

## 2023-01-17 DIAGNOSIS — Z3A39 39 weeks gestation of pregnancy: Secondary | ICD-10-CM | POA: Diagnosis not present

## 2023-01-17 DIAGNOSIS — O09899 Supervision of other high risk pregnancies, unspecified trimester: Secondary | ICD-10-CM

## 2023-01-17 DIAGNOSIS — D62 Acute posthemorrhagic anemia: Secondary | ICD-10-CM | POA: Diagnosis not present

## 2023-01-17 MED ORDER — ACETAMINOPHEN 500 MG PO TABS
1000.0000 mg | ORAL_TABLET | Freq: Four times a day (QID) | ORAL | Status: DC | PRN
  Administered 2023-01-18: 1000 mg via ORAL
  Filled 2023-01-17: qty 2

## 2023-01-17 MED ORDER — ONDANSETRON HCL 4 MG/2ML IJ SOLN
4.0000 mg | Freq: Four times a day (QID) | INTRAMUSCULAR | Status: DC | PRN
  Administered 2023-01-18: 4 mg via INTRAVENOUS
  Filled 2023-01-17: qty 2

## 2023-01-17 MED ORDER — LACTATED RINGERS IV SOLN: INTRAVENOUS | Status: DC

## 2023-01-17 MED ORDER — OXYTOCIN BOLUS FROM INFUSION
333.0000 mL | Freq: Once | INTRAVENOUS | Status: AC
  Administered 2023-01-18: 333 mL via INTRAVENOUS

## 2023-01-17 MED ORDER — FENTANYL CITRATE (PF) 100 MCG/2ML IJ SOLN: 50.0000 ug | INTRAMUSCULAR | Status: DC | PRN

## 2023-01-17 MED ORDER — SODIUM CHLORIDE 0.9% FLUSH: 3.0000 mL | INTRAVENOUS | Status: DC | PRN

## 2023-01-17 MED ORDER — LACTATED RINGERS IV SOLN: 500.0000 mL | INTRAVENOUS | Status: DC | PRN

## 2023-01-17 MED ORDER — LIDOCAINE HCL (PF) 1 % IJ SOLN: 30.0000 mL | INTRAMUSCULAR | Status: DC | PRN

## 2023-01-17 MED ORDER — OXYTOCIN-SODIUM CHLORIDE 30-0.9 UT/500ML-% IV SOLN
2.5000 [IU]/h | INTRAVENOUS | Status: DC
  Administered 2023-01-18: 2.5 [IU]/h via INTRAVENOUS
  Filled 2023-01-17: qty 500

## 2023-01-17 MED ORDER — SODIUM CHLORIDE 0.9 % IV SOLN: 250.0000 mL | INTRAVENOUS | Status: DC | PRN

## 2023-01-17 MED ORDER — SODIUM CHLORIDE 0.9% FLUSH: 3.0000 mL | Freq: Two times a day (BID) | INTRAVENOUS | Status: DC

## 2023-01-17 MED ORDER — SOD CITRATE-CITRIC ACID 500-334 MG/5ML PO SOLN: 30.0000 mL | ORAL | Status: DC | PRN

## 2023-01-17 MED ORDER — CALCIUM CARBONATE ANTACID 500 MG PO CHEW: 2.0000 | CHEWABLE_TABLET | ORAL | Status: DC | PRN

## 2023-01-17 NOTE — H&P (Signed)
OB History & Physical   History of Present Illness:   Chief Complaint: Contractions   HPI:  Bridget Hughes is a 37 y.o. G1P0 female at [redacted]w[redacted]d, Patient's last menstrual period was 04/16/2022 (exact date)., consistent with [place] at 101w1d, with Estimated Date of Delivery: 01/21/23.  She presents to L&D for contractions that have gotten progressively worse through out the day.  They started earlier this morning but have become 5 minutes or less starting around 1700.  Denies LOF or vaginal bleeding.  Reports active fetal movement.    Reports active fetal movement  Contractions: every 3 to 4 minutes starting around 1700 LOF/SROM: denies  Vaginal bleeding: denies   Factors complicating pregnancy:  AMA Obesity  Rubella non-immune  History of anxiety   Patient Active Problem List   Diagnosis Date Noted   Uterine contractions during pregnancy 01/17/2023   Normal labor 01/17/2023    Prenatal Transfer Tool  Maternal Diabetes: No Genetic Screening: Declined Maternal Ultrasounds/Referrals: Normal Fetal Ultrasounds or other Referrals:  None Maternal Substance Abuse:  No Significant Maternal Medications:  None Significant Maternal Lab Results: Group B Strep negative  Maternal Medical History:   Past Medical History:  Diagnosis Date   Concussion    Renal stone    UTI (urinary tract infection)     History reviewed. No pertinent surgical history.  Allergies  Allergen Reactions   Bee Venom Anaphylaxis   Shellfish Allergy Swelling    itching    Prior to Admission medications   Medication Sig Start Date End Date Taking? Authorizing Provider  citalopram (CELEXA) 20 MG tablet Take 20 mg by mouth daily. 07/15/16 07/15/17  [provider]  SUMAtriptan (IMITREX) 50 MG tablet Take 50 mg by mouth as needed. 08/17/16   [provider]  traZODone (DESYREL) 50 MG tablet Take 1 tablet by mouth at bedtime as needed. 09/23/16   [provider]     Prenatal care site:   [facility]  OB History  Gravida Para Term Preterm AB Living  1 0 0 0 0 0  SAB IAB Ectopic Multiple Live Births  0 0 0 0 0    # Outcome Date GA Lbr Len/2nd Weight Sex Type Anes PTL Lv  1 Current              Social History: She  reports that she has been smoking. She has never used smokeless tobacco. She reports that she does not drink alcohol.  Family History: family history is not on file.   Review of Systems: A full review of systems was performed and negative except as noted in the HPI.     Physical Exam:  Vital Signs: BP 133/79   Pulse 74   Temp 98.8 F (37.1 C) (Oral)   LMP 04/16/2022 (Exact Date)   General: no acute distress.  HEENT: normocephalic, atraumatic Heart: regular rate & rhythm Lungs: normal respiratory effort Abdomen: soft, gravid, non-tender;  EFW: 7 1/2 Pelvic:   External: Normal external female genitalia  Cervix: Dilation: 3.5 / Effacement (%): 90 / Station: 0    Extremities: non-tender, symmetric, No edema bilaterally.  DTRs: 2+/2+  Neurologic: Alert & oriented x 3.    No results found for this or any previous visit (from the past 24 hour(s)).  Pertinent Results:  Prenatal Labs: Blood type/Rh A POS Performed at [facility], [address] [place], [place] [address]    Antibody screen Negative    Rubella Nonimmune (02/02 0000)   Varicella  Immune  RPR Nonreactive (07/16 0000)   HBsAg Negative (02/02 0000)  Hep C NR   HIV Non-reactive (07/16 0000)   GC neg  Chlamydia neg  Genetic screening Declined   1 hour GTT 114  3 hour GTT N/A  GBS Negative/-- (07/16 0000)    FHT:  FHR: 140 bpm, variability: moderate,  accelerations:  Present,  decelerations:  Absent Category/reactivity:  Category I UC:   regular, every 2-4 minutes   Cephalic by Leopolds and SVE   No results found.  Assessment:  Bridget Hughes is a 37 y.o. G1P0 female at [redacted]w[redacted]d with normal labor.   Plan:  1. Admit to Labor & Delivery - consents  reviewed and obtained - Dr. Jean Rosenthal notified of admission and plan of care   2. Fetal Well being  - Fetal Tracing: cat 1 - Group B Streptococcus ppx not indicated: GBS negative - Presentation: cephalic confirmed by SVE   3. Routine OB: - Prenatal labs reviewed, as above - Rh positive - CBC, T&S, RPR on admit - Regular diet, saline lock  4. Monitoring of labor  - Contractions monitored with external toco - Pelvis adequate for trial of labor  - Plan for expectant management  - Augmentation with oxytocin and AROM as appropriate  - Plan for  intermittent fetal monitoring  - Maternal pain control as desired; planning regional anesthesia - Anticipate vaginal delivery  5. Post Partum Planning: - Infant feeding: breast feeding - Contraception:  Undecided  - Flu vaccine:  N/A - Tdap vaccine: Given prenatally - RSV vaccine:  N/A  Gustavo Lah, CNM 01/17/23 11:34 PM  Margaretmary Eddy, CNM Certified Nurse Midwife [place]  [facility]

## 2023-01-17 NOTE — OB Triage Note (Signed)
Patient is G1P0 [redacted]w[redacted]d is seen ar [facility] is c/o contractions every 3-5 minutes. Rates 8/10. States she lost mucous plug last night and has been contracting since 4am. FHR 135 Good variability CAT 1 . Denies any other complaints at this time. Provider will be notified.

## 2023-01-17 NOTE — H&P (Incomplete)
OB History & Physical   History of Present Illness:   Chief Complaint: Contractions   HPI:  Bridget Hughes is a 37 y.o. G1P0 female at [redacted]w[redacted]d, Patient's last menstrual period was 04/16/2022 (exact date)., consistent with [place] at [redacted]w[redacted]d, with Estimated Date of Delivery: 01/21/23.  She presents to L&D for contractions that have gotten progressively worse through out the day.  They started earlier this morning but have become 5 minutes or less starting around 1700.  Denies LOF or vaginal bleeding.  Reports active fetal movement.    Reports active fetal movement  Contractions: every 3 to 4 minutes starting around 1700 LOF/SROM: denies  Vaginal bleeding: denies   Factors complicating pregnancy:  AMA Obesity  Rubella non-immune  History of anxiety   Patient Active Problem List   Diagnosis Date Noted  . Uterine contractions during pregnancy 01/17/2023  . Normal labor 01/17/2023    Prenatal Transfer Tool  Maternal Diabetes: No Genetic Screening: Normal Maternal Ultrasounds/Referrals: Normal Fetal Ultrasounds or other Referrals:  None Maternal Substance Abuse:  No Significant Maternal Medications:  None Significant Maternal Lab Results: Group B Strep negative  Maternal Medical History:   Past Medical History:  Diagnosis Date  . Concussion   . Renal stone   . UTI (urinary tract infection)     History reviewed. No pertinent surgical history.  Allergies  Allergen Reactions  . Bee Venom Anaphylaxis  . Shellfish Allergy Swelling    itching    Prior to Admission medications   Medication Sig Start Date End Date Taking? Authorizing Provider  citalopram (CELEXA) 20 MG tablet Take 20 mg by mouth daily. 07/15/16 07/15/17  [provider]  SUMAtriptan (IMITREX) 50 MG tablet Take 50 mg by mouth as needed. 08/17/16   [provider]  traZODone (DESYREL) 50 MG tablet Take 1 tablet by mouth at bedtime as needed. 09/23/16   [provider]     Prenatal care site:   [facility]  OB History  Gravida Para Term Preterm AB Living  1 0 0 0 0 0  SAB IAB Ectopic Multiple Live Births  0 0 0 0 0    # Outcome Date GA Lbr Len/2nd Weight Sex Type Anes PTL Lv  1 Current              Social History: She  reports that she has been smoking. She has never used smokeless tobacco. She reports that she does not drink alcohol.  Family History: family history is not on file.   Review of Systems: A full review of systems was performed and negative except as noted in the HPI.     Physical Exam:  Vital Signs: BP 133/79   Pulse 74   Temp 98.8 F (37.1 C) (Oral)   LMP 04/16/2022 (Exact Date)   General: no acute distress.  HEENT: normocephalic, atraumatic Heart: regular rate & rhythm Lungs: normal respiratory effort Abdomen: soft, gravid, non-tender;  EFW: 7 1/2 Pelvic:   External: Normal external female genitalia  Cervix: Dilation: 3.5 / Effacement (%): 90 / Station: 0    Extremities: non-tender, symmetric, No edema bilaterally.  DTRs: 2+/2+  Neurologic: Alert & oriented x 3.    No results found for this or any previous visit (from the past 24 hour(s)).  Pertinent Results:  Prenatal Labs: Blood type/Rh A POS Performed at [facility], [address] [place], [place] [address]    Antibody screen Negative    Rubella Nonimmune (02/02 0000)   Varicella  Immune  RPR Nonreactive (07/16 0000)   HBsAg Negative (02/02 0000)  Hep C NR   HIV Non-reactive (07/16 0000)   GC neg  Chlamydia neg  Genetic screening cfDNA negative   1 hour GTT ***  3 hour GTT ***  GBS ***Negative/-- (07/16 0000)    FHT:  {findings; monitor fetal heart monitor:21620:::0} Category/reactivity:  {CHL FWB PLAN:21624:::0} UC:   {obgyn contractions reg/irreg:312982:::0}   Cephalic by Leopolds and ***SVE   No results found.  Assessment:  Bridget Hughes is a 37 y.o. G1P0 female at [redacted]w[redacted]d with ***.   Plan:  1. Admit to Labor & Delivery - consents reviewed  and obtained - Dr. Marland Kitchen notified of admission and plan of care   2. Fetal Well being  - Fetal Tracing: *** - Group B Streptococcus ppx *** indicated: GBS {gen pos neg:315643::"negative"} - Presentation: *** confirmed by ***   3. Routine OB: - Prenatal labs reviewed, as above - Rh {gen pos neg:315643::"positive"} - CBC, T&S, RPR on admit - {AMOBDIET:27751::"Regular diet"}, {AMOBIVSTATUS:27752::"saline lock"}  4. {Labor monitoring:24212} of labor  - Contractions monitored with external toco - Pelvis proven to *** adequate for trial of labor  - Plan for {Labor Management Plan:25459} - {H&P Labor management plans:25460} - Plan for  {Frequency of fetal monitoring:24213} - Maternal pain control as desired; planning {Labor pain management options:24214} - Anticipate vaginal delivery  5. Post Partum Planning: - Infant feeding: {AMINFANTFEED:27794::"breast feeding"} - Contraception: {PLAN CONTRACEPTION:313102} - Flu vaccine: {chl tdap given:23962::"Given prenatally"} - Tdap vaccine: {chl tdap given:23962::"Given prenatally"} - RSV vaccine: {chl tdap given:23962}  Gustavo Lah, CNM 01/17/23 11:34 PM  Margaretmary Eddy, CNM Certified Nurse Midwife [place]  [facility]

## 2023-01-18 ENCOUNTER — Encounter: Payer: Self-pay | Admitting: Obstetrics and Gynecology

## 2023-01-18 ENCOUNTER — Inpatient Hospital Stay: Payer: BC Managed Care – PPO | Admitting: Anesthesiology

## 2023-01-18 LAB — TYPE AND SCREEN
ABO/RH(D): A POS
Antibody Screen: NEGATIVE

## 2023-01-18 MED ORDER — LIDOCAINE HCL (PF) 1 % IJ SOLN
INTRAMUSCULAR | Status: AC
  Filled 2023-01-18: qty 30

## 2023-01-18 MED ORDER — OXYTOCIN-SODIUM CHLORIDE 30-0.9 UT/500ML-% IV SOLN
1.0000 m[IU]/min | INTRAVENOUS | Status: DC
  Administered 2023-01-18: 2 m[IU]/min via INTRAVENOUS

## 2023-01-18 MED ORDER — DIBUCAINE (PERIANAL) 1 % EX OINT: 1.0000 | TOPICAL_OINTMENT | CUTANEOUS | Status: DC | PRN

## 2023-01-18 MED ORDER — PHENYLEPHRINE 80 MCG/ML (10ML) SYRINGE FOR IV PUSH (FOR BLOOD PRESSURE SUPPORT): 80.0000 ug | PREFILLED_SYRINGE | INTRAVENOUS | Status: DC | PRN

## 2023-01-18 MED ORDER — OXYCODONE HCL 5 MG PO TABS: 10.0000 mg | ORAL_TABLET | ORAL | Status: DC | PRN

## 2023-01-18 MED ORDER — OXYTOCIN 10 UNIT/ML IJ SOLN
INTRAMUSCULAR | Status: AC
  Filled 2023-01-18: qty 2

## 2023-01-18 MED ORDER — MEASLES, MUMPS & RUBELLA VAC IJ SOLR
0.5000 mL | INTRAMUSCULAR | Status: DC | PRN
  Filled 2023-01-18 (×2): qty 0.5

## 2023-01-18 MED ORDER — DIPHENHYDRAMINE HCL 25 MG PO CAPS: 25.0000 mg | ORAL_CAPSULE | Freq: Four times a day (QID) | ORAL | Status: DC | PRN

## 2023-01-18 MED ORDER — SIMETHICONE 80 MG PO CHEW: 80.0000 mg | CHEWABLE_TABLET | ORAL | Status: DC | PRN

## 2023-01-18 MED ORDER — EPHEDRINE 5 MG/ML INJ: 10.0000 mg | INTRAVENOUS | Status: DC | PRN

## 2023-01-18 MED ORDER — ONDANSETRON HCL 4 MG/2ML IJ SOLN: 4.0000 mg | INTRAMUSCULAR | Status: DC | PRN

## 2023-01-18 MED ORDER — TERBUTALINE SULFATE 1 MG/ML IJ SOLN: 0.2500 mg | Freq: Once | INTRAMUSCULAR | Status: DC | PRN

## 2023-01-18 MED ORDER — ACETAMINOPHEN 325 MG PO TABS: 650.0000 mg | ORAL_TABLET | ORAL | Status: DC | PRN

## 2023-01-18 MED ORDER — BENZOCAINE-MENTHOL 20-0.5 % EX AERO: 1.0000 | INHALATION_SPRAY | CUTANEOUS | Status: DC | PRN

## 2023-01-18 MED ORDER — LACTATED RINGERS IV SOLN
500.0000 mL | Freq: Once | INTRAVENOUS | Status: AC
  Administered 2023-01-18: 500 mL via INTRAVENOUS

## 2023-01-18 MED ORDER — ONDANSETRON HCL 4 MG PO TABS: 4.0000 mg | ORAL_TABLET | ORAL | Status: DC | PRN

## 2023-01-18 MED ORDER — WITCH HAZEL-GLYCERIN EX PADS: 1.0000 | MEDICATED_PAD | CUTANEOUS | Status: DC | PRN

## 2023-01-18 MED ORDER — FERROUS SULFATE 325 (65 FE) MG PO TABS
325.0000 mg | ORAL_TABLET | Freq: Two times a day (BID) | ORAL | Status: DC
  Administered 2023-01-19 – 2023-01-20 (×3): 325 mg via ORAL
  Filled 2023-01-18 (×3): qty 1

## 2023-01-18 MED ORDER — SODIUM CHLORIDE 0.9 % IV SOLN
INTRAVENOUS | Status: DC | PRN
  Administered 2023-01-18: 8 mL via EPIDURAL
  Administered 2023-01-18: 6 mL via EPIDURAL
  Administered 2023-01-18: 4 mL via EPIDURAL

## 2023-01-18 MED ORDER — SENNOSIDES-DOCUSATE SODIUM 8.6-50 MG PO TABS
2.0000 | ORAL_TABLET | Freq: Every day | ORAL | Status: DC
  Administered 2023-01-19: 2 via ORAL
  Filled 2023-01-18: qty 2

## 2023-01-18 MED ORDER — MISOPROSTOL 200 MCG PO TABS
ORAL_TABLET | ORAL | Status: AC
  Filled 2023-01-18: qty 4

## 2023-01-18 MED ORDER — IBUPROFEN 600 MG PO TABS
600.0000 mg | ORAL_TABLET | Freq: Four times a day (QID) | ORAL | Status: DC
  Administered 2023-01-18 – 2023-01-20 (×6): 600 mg via ORAL
  Filled 2023-01-18 (×6): qty 1

## 2023-01-18 MED ORDER — PRENATAL MULTIVITAMIN CH
1.0000 | ORAL_TABLET | Freq: Every day | ORAL | Status: DC
  Administered 2023-01-19: 1 via ORAL
  Filled 2023-01-18: qty 1

## 2023-01-18 MED ORDER — COCONUT OIL OIL
1.0000 | TOPICAL_OIL | Status: DC | PRN
  Administered 2023-01-19: 1 via TOPICAL
  Filled 2023-01-18: qty 7.5

## 2023-01-18 MED ORDER — OXYCODONE HCL 5 MG PO TABS: 5.0000 mg | ORAL_TABLET | ORAL | Status: DC | PRN

## 2023-01-18 MED ORDER — TETANUS-DIPHTH-ACELL PERTUSSIS 5-2.5-18.5 LF-MCG/0.5 IM SUSY
0.5000 mL | PREFILLED_SYRINGE | Freq: Once | INTRAMUSCULAR | Status: DC
  Filled 2023-01-18: qty 0.5

## 2023-01-18 MED ORDER — LIDOCAINE HCL (PF) 1 % IJ SOLN
INTRAMUSCULAR | Status: DC | PRN
  Administered 2023-01-18 (×2): 3 mL via SUBCUTANEOUS

## 2023-01-18 MED ORDER — LIDOCAINE-EPINEPHRINE (PF) 1.5 %-1:200000 IJ SOLN
INTRAMUSCULAR | Status: DC | PRN
  Administered 2023-01-18 (×2): 3 mL via EPIDURAL

## 2023-01-18 MED ORDER — FENTANYL-BUPIVACAINE-NACL 0.5-0.125-0.9 MG/250ML-% EP SOLN
12.0000 mL/h | EPIDURAL | Status: DC | PRN
  Administered 2023-01-18: 12 mL/h via EPIDURAL
  Filled 2023-01-18 (×2): qty 250

## 2023-01-18 MED ORDER — AMMONIA AROMATIC IN INHA
RESPIRATORY_TRACT | Status: AC
  Filled 2023-01-18: qty 10

## 2023-01-18 MED ORDER — DIPHENHYDRAMINE HCL 50 MG/ML IJ SOLN: 12.5000 mg | INTRAMUSCULAR | Status: DC | PRN

## 2023-01-18 MED ORDER — CITALOPRAM HYDROBROMIDE 20 MG PO TABS
20.0000 mg | ORAL_TABLET | Freq: Every day | ORAL | Status: DC
  Filled 2023-01-18 (×2): qty 1

## 2023-01-18 NOTE — Progress Notes (Signed)
Labor Progress Note  Bridget Hughes is a 37 y.o. G1P0 at [redacted]w[redacted]d by LMP admitted for active labor  Subjective: Assumed Care. Discussed POC. Pt is comfortable with epidural and is happy with her labor progress.  Objective: BP 114/63 (BP Location: Right Arm)   Pulse 80   Temp 98.3 F (36.8 C) (Oral)   Resp 16   LMP 04/16/2022 (Exact Date)   SpO2 92%   Fetal Assessment: FHT:  FHR: 125 bpm, variability: moderate,  accelerations:  Present,  decelerations:  Absent Category/reactivity:  Category I UC:   regular, every 4 minutes SVE:    Dilation: 7cm  Effacement: 90%  Station:  0  Consistency: medium  Position: anterior  Membrane status:Possible AROM at 0645 Amniotic color: unknown  Labs: Lab Results  Component Value Date   WBC 22.3 (H) 01/17/2023   HGB 12.1 01/17/2023   HCT 35.2 (L) 01/17/2023   MCV 93.6 01/17/2023   PLT 322 01/17/2023    Assessment / Plan: Spontaneous labor, progressing normally  Labor: Progressing normally Preeclampsia:   114/63 Fetal Wellbeing:  Category I Pain Control:  Epidural I/D:   Afebrile, GBS neg, possible AROM x1hr Anticipated MOD:  NSVD  Cyril Mourning, CNM 01/18/2023, 8:19 AM

## 2023-01-18 NOTE — Anesthesia Procedure Notes (Signed)
Epidural Patient location during procedure: OB  Staffing Anesthesiologist: Arita Miss, MD Performed: anesthesiologist   Preanesthetic Checklist Completed: patient identified, IV checked, site marked, risks and benefits discussed, surgical consent, monitors and equipment checked, pre-op evaluation and timeout performed  Epidural Patient position: sitting Prep: ChloraPrep Patient monitoring: heart rate, continuous pulse ox and blood pressure Approach: midline Location: L4-L5 Injection technique: LOR saline  Needle:  Needle type: Tuohy  Needle gauge: 17 G Needle length: 9 cm Needle insertion depth: 8 cm Catheter type: closed end flexible Catheter size: 19 Gauge Catheter at skin depth: 13 cm Test dose: negative and 1.5% lidocaine with Epi 1:200 K  Assessment Sensory level: T10 Events: blood not aspirated, no cerebrospinal fluid, injection not painful, no injection resistance, no paresthesia and negative IV test  Additional Notes First/one attempt Pt. Evaluated and documentation done after procedure finished. Patient identified. Risks/Benefits/Options discussed with patient including but not limited to bleeding, infection, nerve damage, paralysis, failed block, incomplete pain control, headache, blood pressure changes, nausea, vomiting, reactions to medication both or allergic, itching and postpartum back pain. Confirmed with bedside nurse the patient's most recent platelet count. Confirmed with patient that they are not currently taking any anticoagulation, have any bleeding history or any family history of bleeding disorders. Patient expressed understanding and wished to proceed. All questions were answered. Sterile technique was used throughout the entire procedure. Please see nursing notes for vital signs. Test dose was given through epidural catheter and negative prior to continuing to dose epidural or start infusion. Warning signs of high block given to the patient including  shortness of breath, tingling/numbness in hands, complete motor block, or any concerning symptoms with instructions to call for help. Patient was given instructions on fall risk and not to get out of bed. All questions and concerns addressed with instructions to call with any issues or inadequate analgesia.     Patient tolerated the insertion well without immediate complications.  Reason for block: procedure for painReason for block:procedure for pain

## 2023-01-18 NOTE — Progress Notes (Signed)
L&D Note    Subjective:  Feeling more comfortable with epidural  Objective:   Vitals:   01/18/23 0420 01/18/23 0425 01/18/23 0430 01/18/23 0645  BP:  (!) 144/124  103/62  Pulse:  90  84  Temp:      TempSrc:      SpO2: 93% 95% 95% 95%    Current Vital Signs 24h Vital Sign Ranges  T 98 F (36.7 C) Temp  Avg: 98.4 F (36.9 C)  Min: 98 F (36.7 C)  Max: 98.8 F (37.1 C)  BP 103/62 BP  Min: 103/62  Max: 144/124  HR 84 Pulse  Avg: 88.4  Min: 74  Max: 106  RR   No data recorded  SaO2 95 %   SpO2  Avg: 94.3 %  Min: 93 %  Max: 96 %      Gen: alert, cooperative, no distress FHR: Baseline: 130 bpm, Variability: moderate, Accels: Present, Decels: none Toco: regular, every 2-4 minutes SVE: Dilation: 5.5 Effacement (%): 90 Station: 0 Exam by:: A Lataunya Ruud CNM  Medications SCHEDULED MEDICATIONS   oxytocin 40 units in LR 1000 mL  333 mL Intravenous Once   sodium chloride flush  3 mL Intravenous Q12H    MEDICATION INFUSIONS   sodium chloride     fentaNYL 2 mcg/mL w/bupivacaine 0.125% in NS 250 mL 12 mL/hr (01/18/23 0319)   lactated ringers     lactated ringers 125 mL/hr at 01/18/23 0338   oxytocin      PRN MEDICATIONS  sodium chloride, acetaminophen, calcium carbonate, diphenhydrAMINE, ePHEDrine, ePHEDrine, fentaNYL (SUBLIMAZE) injection, fentaNYL 2 mcg/mL w/bupivacaine 0.125% in NS 250 mL, lactated ringers, lidocaine (PF), ondansetron, phenylephrine, phenylephrine, sodium chloride flush, sodium citrate-citric acid   Assessment & Plan:  37 y.o. G1P0 at [redacted]w[redacted]d admitted for early labor  -Labor: Early latent labor. -Fetal Well-being: Category I -GBS: negative -Membranes intact - AROM attempted, unable to break bag of water, will reattempt as indicated  -Continue present management. -Analgesia: regional anesthesia   Bridget Hughes, CNM  01/18/2023 6:47 AM  [facility]

## 2023-01-18 NOTE — Progress Notes (Signed)
Labor Progress Note  Bridget Hughes is a 37 y.o. G1P0 at [redacted]w[redacted]d by LMP admitted for active labor  Subjective: Labor progress is slowing, discussed starting pitocin, pt was agreeable.  Objective: BP 129/84   Pulse 82   Temp 97.9 F (36.6 C) (Oral)   Resp 18   LMP 04/16/2022 (Exact Date)   SpO2 95%   Fetal Assessment: FHT:  FHR: 125 bpm, variability: moderate,  accelerations:  Present,  decelerations:  Absent Category/reactivity:  Category I UC:   regular, every 4 minutes SVE:    Dilation: 8cm  Effacement: 90%  Station:  0  Consistency: medium  Position: anterior  Membrane status:Possible AROM at 0645 Amniotic color: unknown  Labs: Lab Results  Component Value Date   WBC 22.3 (H) 01/17/2023   HGB 12.1 01/17/2023   HCT 35.2 (L) 01/17/2023   MCV 93.6 01/17/2023   PLT 322 01/17/2023    Assessment / Plan: Spontaneous labor, progressing normally Will start pitocin to encourage effacement and dilation  Labor: Progressing normally Preeclampsia:   129/84 Fetal Wellbeing:  Category I Pain Control:  Epidural I/D:   Afebrile, GBS neg, possible AROM x5hr Anticipated MOD:  NSVD  Cyril Mourning, CNM 01/18/2023, 12:21 PM

## 2023-01-18 NOTE — Anesthesia Procedure Notes (Signed)
Epidural Patient location during procedure: OB Start time: 01/18/2023 2:40 PM End time: 01/18/2023 3:00 PM  Staffing Anesthesiologist: Piscitello, Cleda Mccreedy, MD Resident/CRNA: Jeanine Luz, CRNA Performed: resident/CRNA   Preanesthetic Checklist Completed: patient identified, IV checked, site marked, risks and benefits discussed, surgical consent, monitors and equipment checked, pre-op evaluation and timeout performed  Epidural Patient position: sitting Prep: ChloraPrep Patient monitoring: heart rate, continuous pulse ox and blood pressure Approach: midline Location: L4-L5 Injection technique: LOR saline  Needle:  Needle type: Tuohy  Needle gauge: 17 G Needle length: 9 cm and 9 Needle insertion depth: 7.5 cm Catheter type: closed end flexible Catheter size: 19 Gauge Catheter at skin depth: 14 cm Test dose: negative and 1.5% lidocaine with Epi 1:200 K  Assessment Sensory level: T10 Events: blood not aspirated, no cerebrospinal fluid, injection not painful, no injection resistance, no paresthesia and negative IV test  Additional Notes 1 attempt Pt. Evaluated and documentation done after procedure finished. Patient identified. Risks/Benefits/Options discussed with patient including but not limited to bleeding, infection, nerve damage, paralysis, failed block, incomplete pain control, headache, blood pressure changes, nausea, vomiting, reactions to medication both or allergic, itching and postpartum back pain. Confirmed with bedside nurse the patient's most recent platelet count. Confirmed with patient that they are not currently taking any anticoagulation, have any bleeding history or any family history of bleeding disorders. Patient expressed understanding and wished to proceed. All questions were answered. Sterile technique was used throughout the entire procedure. Please see nursing notes for vital signs. Test dose was given through epidural catheter and negative prior to  continuing to dose epidural or start infusion. Warning signs of high block given to the patient including shortness of breath, tingling/numbness in hands, complete motor block, or any concerning symptoms with instructions to call for help. Patient was given instructions on fall risk and not to get out of bed. All questions and concerns addressed with instructions to call with any issues or inadequate analgesia.    Patient tolerated the insertion well without immediate complications.Reason for block:procedure for pain

## 2023-01-18 NOTE — Anesthesia Preprocedure Evaluation (Signed)
Anesthesia Evaluation  Patient identified by MRN, date of birth, ID band Patient awake  General Assessment Comment:  WBC = 22k, no signs of infection, membranes not ruptured.  Reviewed: Allergy & Precautions, NPO status , Patient's Chart, lab work & pertinent test results  History of Anesthesia Complications Negative for: history of anesthetic complications  Airway Mallampati: II  TM Distance: >3 FB Neck ROM: Full    Dental no notable dental hx. (+) Teeth Intact   Pulmonary neg pulmonary ROS, neg sleep apnea, neg COPD, Patient abstained from smoking.Not current smoker, former smoker   Pulmonary exam normal breath sounds clear to auscultation       Cardiovascular Exercise Tolerance: Good METS(-) hypertension(-) CAD and (-) Past MI negative cardio ROS (-) dysrhythmias  Rhythm:Regular Rate:Normal - Systolic murmurs    Neuro/Psych negative neurological ROS  negative psych ROS   GI/Hepatic ,neg GERD  ,,(+)     (-) substance abuse    Endo/Other  neg diabetes    Renal/GU negative Renal ROS     Musculoskeletal   Abdominal   Peds  Hematology   Anesthesia Other Findings Past Medical History: No date: Concussion No date: Renal stone No date: UTI (urinary tract infection)  Reproductive/Obstetrics (+) Pregnancy                             Anesthesia Physical Anesthesia Plan  ASA: 2  Anesthesia Plan: Epidural   Post-op Pain Management:    Induction:   PONV Risk Score and Plan: 2 and Treatment may vary due to age or medical condition and Ondansetron  Airway Management Planned: Natural Airway  Additional Equipment:   Intra-op Plan:   Post-operative Plan:   Informed Consent: I have reviewed the patients History and Physical, chart, labs and discussed the procedure including the risks, benefits and alternatives for the proposed anesthesia with the patient or authorized representative  who has indicated his/her understanding and acceptance.       Plan Discussed with: Surgeon  Anesthesia Plan Comments: (Discussed R/B/A of neuraxial anesthesia technique with patient: - rare risks of spinal/epidural hematoma, nerve damage, infection - Risk of PDPH - Risk of itching - Risk of nausea and vomiting - Risk of poor block necessitating replacement of epidural. - Risk of allergic reactions. Patient voiced understanding.)       Anesthesia Quick Evaluation

## 2023-01-18 NOTE — Discharge Summary (Signed)
Obstetrical Discharge Summary  Patient Name: Bridget Hughes DOB: October 09, 1985 MRN: [id number]  Date of Admission: 01/17/2023 Date of Delivery: 01/18/23 Delivered by: Haroldine Laws, CNM  Date of Discharge: 01/20/2023  Primary OB: [facility] [id number] last menstrual period was 04/16/2022 (exact date). EDC Estimated Date of Delivery: 01/21/23 Gestational Age at Delivery: [redacted]w[redacted]d   Antepartum complications:  AMA Obesity  Rubella non-immune  History of anxiety   Admitting Diagnosis: Uterine contractions during pregnancy [O47.9] Normal labor [O80, Z37.9]  Secondary Diagnosis: Patient Active Problem List   Diagnosis Date Noted   NSVD (normal spontaneous vaginal delivery) 01/18/2023   Rubella non-immune status, antepartum 01/17/2023   History of anxiety 07/16/2022   Obesity in pregnancy, antepartum 07/16/2022   Encounter for supervision of normal first pregnancy in third trimester 07/13/2022    Discharge Diagnosis: Term Pregnancy Delivered      Augmentation: AROM and Pitocin Complications: None Intrapartum complications/course: see delivery note Delivery Type: spontaneous vaginal delivery Anesthesia: epidural anesthesia Placenta: spontaneous To Pathology: No  Laceration: 2nd degree and vaginal Episiotomy: none Newborn Data: Live born female  Birth Weight:   APGAR: ,   Newborn Delivery   Birth date/time: 01/18/2023 20:48:00 Delivery type: Vaginal, Spontaneous     Postpartum Procedures: none Edinburgh:     01/19/2023    4:00 PM  Edinburgh Postnatal Depression Scale Screening Tool  I have been able to laugh and see the funny side of things. 0  I have looked forward with enjoyment to things. 0  I have blamed myself unnecessarily when things went wrong. 1  I have been anxious or worried for no good reason. 1  I have felt scared or panicky for no good reason. 0  Things have been getting on top of me. 0  I have been so unhappy that I have had difficulty sleeping. 0   I have felt sad or miserable. 0  I have been so unhappy that I have been crying. 0  The thought of harming myself has occurred to me. 0  Edinburgh Postnatal Depression Scale Total 2     Post partum course:  Patient had an uncomplicated postpartum course.  By time of discharge on PPD#2, her pain was controlled on oral pain medications; she had appropriate lochia and was ambulating, voiding without difficulty and tolerating regular diet.  She was deemed stable for discharge to home.    Discharge Physical Exam:  BP 118/78 (BP Location: Right Arm)   Pulse 73   Temp 98 F (36.7 C) (Oral)   Resp 18   Ht 5\' 4"  (1.626 m)   Wt 93.9 kg   LMP 04/16/2022 (Exact Date)   SpO2 96%   Breastfeeding Unknown   BMI 35.53 kg/m   General: NAD CV: RRR Pulm: CTABL, nl effort ABD: s/nd/nt, fundus firm and below the umbilicus Lochia: moderate Perineum:minimal edema/repair well approximated DVT Evaluation: LE non-ttp, no evidence of DVT on exam.  Hemoglobin  Date Value Ref Range Status  01/19/2023 10.0 (L) 12.0 - 15.0 g/dL Final   HGB  Date Value Ref Range Status  09/18/2014 13.7 12.0 - 16.0 g/dL Final   HCT  Date Value Ref Range Status  01/19/2023 29.4 (L) 36.0 - 46.0 % Final  09/18/2014 41.2 35.0 - 47.0 % Final    Risk assessment for postpartum VTE and prophylactic treatment: Very high risk factors: None High risk factors: None Moderate risk factors: None and BMI 30-40 kg/m2  Postpartum VTE prophylaxis with LMWH not indicated  Disposition: stable, discharge  to home. Baby Feeding: breast feeding Baby Disposition: home with mom  Rh Immune globulin indicated: No Rubella vaccine given: given prior to discharge Varivax vaccine given: was not indicated Flu vaccine given in AP setting: n/a Tdap vaccine given in AP setting: Yes   Contraception:  TBD  Prenatal Labs:  Blood type/Rh A POS Performed at [facility], [address] [place], [place] [address]    Antibody  screen Negative    Rubella Nonimmune (02/02 0000)   Varicella Immune  RPR Nonreactive (07/16 0000)   HBsAg Negative (02/02 0000)  Hep C NR   HIV Non-reactive (07/16 0000)   GC neg  Chlamydia neg  Genetic screening Declined   1 hour GTT 114  3 hour GTT N/A  GBS Negative/-- (07/16 0000)     Plan:  Bridget Hughes was discharged to home in good condition.  Discharge Medications: Allergies as of 01/20/2023       Reactions   Bee Venom Anaphylaxis   Shellfish Allergy Swelling   itching        Medication List     STOP taking these medications    citalopram 20 MG tablet Commonly known as: CELEXA   SUMAtriptan 50 MG tablet Commonly known as: IMITREX   traZODone 50 MG tablet Commonly known as: DESYREL       TAKE these medications    acetaminophen 325 MG tablet Commonly known as: Tylenol Take 2 tablets (650 mg total) by mouth every 4 (four) hours as needed (for pain scale < 4).   benzocaine-Menthol 20-0.5 % Aero Commonly known as: DERMOPLAST Apply 1 Application topically as needed for irritation (perineal discomfort).   coconut oil Oil Apply 1 Application topically as needed.   dibucaine 1 % Oint Commonly known as: NUPERCAINAL Place 1 Application rectally as needed for hemorrhoids.   ferrous sulfate 325 (65 FE) MG tablet Take 1 tablet (325 mg total) by mouth 2 (two) times daily with a meal.   ibuprofen 600 MG tablet Commonly known as: ADVIL Take 1 tablet (600 mg total) by mouth every 6 (six) hours.   prenatal multivitamin Tabs tablet Take 1 tablet by mouth daily at 12 noon.   senna-docusate 8.6-50 MG tablet Commonly known as: Senokot-S Take 2 tablets by mouth daily.   simethicone 80 MG chewable tablet Commonly known as: MYLICON Chew 1 tablet (80 mg total) by mouth as needed for flatulence.   witch hazel-glycerin pad Commonly known as: TUCKS Apply 1 Application topically as needed for hemorrhoids.         Follow-up Information     Haroldine Laws, CNM. Schedule an appointment as soon as possible for a visit in 2 week(s).   Specialty: Certified Nurse Midwife Why: for mood check Contact information: [address] [place] [address] [phone]         Haroldine Laws, CNM Follow up in 6 week(s).   Specialty: Certified Nurse Midwife Why: pp visit Contact information: [address] [place] [address] [phone]                 Signed:  Chari Manning CNM

## 2023-01-18 NOTE — Progress Notes (Signed)
Labor Progress Note  Bridget Hughes is a 37 y.o. G1P0 at [redacted]w[redacted]d by LMP admitted for active labor  Subjective: Epidural was replaced and pt is very comfortable now.  Objective: BP 112/78   Pulse (!) 195   Temp 97.9 F (36.6 C) (Oral)   Resp 16   Ht 5\' 4"  (1.626 m)   Wt 93.9 kg   LMP 04/16/2022 (Exact Date)   SpO2 93%   BMI 35.53 kg/m   Fetal Assessment: FHT:  FHR: 130 bpm, variability: moderate,  accelerations:  Present,  decelerations:  Absent Category/reactivity:  Category I UC:   regular, every 1-3 minutes SVE:    Dilation: 10cm  Effacement: 100%  Station:  0  Consistency: soft  Position: anterior  Membrane status:Possible AROM at 0645 Amniotic color: unknown  Labs: Lab Results  Component Value Date   WBC 22.3 (H) 01/17/2023   HGB 12.1 01/17/2023   HCT 35.2 (L) 01/17/2023   MCV 93.6 01/17/2023   PLT 322 01/17/2023    Assessment / Plan: Spontaneous labor, progressing normally Will labor down for 1 hour then start pushing  Labor: Progressing normally Preeclampsia:   112/78 Fetal Wellbeing:  Category I Pain Control:  Epidural I/D:   Afebrile, GBS neg, possible AROM x9hr Anticipated MOD:  NSVD  Cyril Mourning, CNM 01/18/2023, 4:38 PM

## 2023-01-19 NOTE — Anesthesia Postprocedure Evaluation (Signed)
Anesthesia Post Note  Patient: Bridget Hughes  Procedure(s) Performed: AN AD HOC LABOR EPIDURAL  Patient location during evaluation: Mother Baby Anesthesia Type: Epidural Level of consciousness: awake and alert Pain management: pain level controlled Vital Signs Assessment: post-procedure vital signs reviewed and stable Respiratory status: spontaneous breathing, nonlabored ventilation and respiratory function stable Cardiovascular status: stable Postop Assessment: no headache, no backache and epidural receding Anesthetic complications: no  No notable events documented.   Last Vitals:  Vitals:   01/19/23 0420 01/19/23 0902  BP: 118/82 97/66  Pulse: 83 83  Resp: 18 16  Temp: 36.6 C 36.7 C  SpO2: 96% 97%    Last Pain:  Vitals:   01/19/23 0902  TempSrc: Oral  PainSc: 0-No pain                 Niketa Turner B Alonza Smoker

## 2023-01-19 NOTE — Progress Notes (Signed)
Post Partum Day 1 Subjective: Doing well, no complaints.  Tolerating regular diet, pain with PO meds, voiding and ambulating without difficulty.  No CP SOB Fever,Chills, N/V or leg pain; denies nipple or breast pain, no HA change of vision, RUQ/epigastric pain  Objective: BP 124/78 (BP Location: Right Arm)   Pulse 82   Temp 97.7 F (36.5 C) (Oral)   Resp 18   Ht 5\' 4"  (1.626 m)   Wt 93.9 kg   LMP 04/16/2022 (Exact Date)   SpO2 97%   Breastfeeding Unknown   BMI 35.53 kg/m    Physical Exam:  General: NAD Breasts: soft/nontender CV: RRR Pulm: nl effort, CTABL Abdomen: soft, NT, BS x 4 Perineum: minimal edema, repair well approximated Lochia: moderate Uterine Fundus: fundus firm and 1 fb below umbilicus DVT Evaluation: no cords, ttp LEs   Recent Labs    01/17/23 2314 01/19/23 0511  HGB 12.1 10.0*  HCT 35.2* 29.4*  WBC 22.3* 22.3*  PLT 322 269    Assessment/Plan: 37 y.o. G1P1001 postpartum day # 1  - Continue routine PP care - Lactation consult PRN - Discussed contraceptive options including implant, IUDs hormonal and non-hormonal, injection, pills/ring/patch, condoms, and NFP.  - Acute blood loss anemia, clinically insignificant - hemoglobin changed from 12.1 to 10.0, patient is asymptomatic, hemodynamically stable; start po ferrous sulfate BID with stool softeners - Immunization status: all Imms up to date  Disposition: Does not desire Dc home today.   Janyce Llanos, CNM 01/19/2023 3:09 PM

## 2023-01-19 NOTE — Lactation Note (Signed)
This note was copied from a baby's chart. Lactation Consultation Note  Patient Name: Bridget Hughes Date: 01/19/2023 Age:35 hours Reason for consult: Initial assessment;1st time breastfeeding  Lactation to the room for initial visit. Mother is resting in bed and states it is about time to feed baby. Encouraged feeding on demand and with cues. If baby is not cueing encouraged hand expression and skin to skin. Taught proper technique for hand expression. Baby was positioned on the right in football position. Baby is attempting to latch and suckles a few times and slips off the nipple or stops sucking. LC and Mother expressed drops into her mouth. Mother states HE is painful. Baby was then positioned onto the left in football. Both breasts are dense but right side has more swelling, making it more difficult to latch onto the right side. Baby was able to latch and maintain better on the left side. Nipple was slightly compressed after feed on the left. Baby was left skin to skin with Mother. Encouraged 8 or more attempts in the first 24 hours and 8 or more good feeds after 24 HOL. Reviewed appropriate diapers for days of life and How to know your baby is getting enough to eat. Reviewed "Understanding Postpartum and Newborn Care" booklet at bedside. [facility] # left on board, encouraged to call for any assistance. Mother has no further questions at this time.   Maternal Data Has patient been taught Hand Expression?: Yes Does the patient have breastfeeding experience prior to this delivery?: No  Feeding Mother's Current Feeding Choice: Breast Milk  LATCH Score Latch: Repeated attempts needed to sustain latch, nipple held in mouth throughout feeding, stimulation needed to elicit sucking reflex.  Audible Swallowing: A few with stimulation  Type of Nipple: Everted at rest and after stimulation (right nipple rectracts with compression)  Comfort (Breast/Nipple): Soft / non-tender  Hold  (Positioning): Assistance needed to correctly position infant at breast and maintain latch.  LATCH Score: 7   Lactation Tools Discussed/Used    Interventions Interventions: Breast feeding basics reviewed;Assisted with latch;Hand express;Position options;Support pillows;Education  Discharge Discharge Education: Engorgement and breast care;Warning signs for feeding baby Pump: Hands Free;Personal (has a hands free pump at home, momcozy)  Consult Status Consult Status: Follow-up    Karel Mowers D Sheila Ocasio 01/19/2023, 12:15 PM

## 2023-01-20 MED ORDER — SIMETHICONE 80 MG PO CHEW: 80.0000 mg | CHEWABLE_TABLET | ORAL | Status: AC | PRN

## 2023-01-20 MED ORDER — DIBUCAINE (PERIANAL) 1 % EX OINT: 1.0000 | TOPICAL_OINTMENT | CUTANEOUS | Status: AC | PRN

## 2023-01-20 MED ORDER — BENZOCAINE-MENTHOL 20-0.5 % EX AERO: 1.0000 | INHALATION_SPRAY | CUTANEOUS | Status: AC | PRN

## 2023-01-20 MED ORDER — WITCH HAZEL-GLYCERIN EX PADS: 1.0000 | MEDICATED_PAD | CUTANEOUS | Status: AC | PRN

## 2023-01-20 MED ORDER — ACETAMINOPHEN 325 MG PO TABS: 650.0000 mg | ORAL_TABLET | ORAL | Status: AC | PRN

## 2023-01-20 MED ORDER — IBUPROFEN 600 MG PO TABS: 600.0000 mg | ORAL_TABLET | Freq: Four times a day (QID) | ORAL | 0 refills | Status: AC

## 2023-01-20 MED ORDER — PRENATAL MULTIVITAMIN CH: 1.0000 | ORAL_TABLET | Freq: Every day | ORAL | Status: AC

## 2023-01-20 MED ORDER — FERROUS SULFATE 325 (65 FE) MG PO TABS: 325.0000 mg | ORAL_TABLET | Freq: Two times a day (BID) | ORAL | Status: AC

## 2023-01-20 MED ORDER — COCONUT OIL OIL: 1.0000 | TOPICAL_OIL | Status: AC | PRN

## 2023-01-20 MED ORDER — SENNOSIDES-DOCUSATE SODIUM 8.6-50 MG PO TABS: 2.0000 | ORAL_TABLET | Freq: Every day | ORAL | Status: AC

## 2023-01-20 NOTE — Discharge Instructions (Signed)

## 2023-01-20 NOTE — Lactation Note (Signed)
This note was copied from a baby's chart. Lactation Consultation Note  Patient Name: Bridget Hughes [id number] Date: 01/20/2023 Age:35 hours Reason for consult: Follow-up assessment;Primapara;Term;Breastfeeding assistance;RN request;Mother's request   Maternal Data This is mom's 1st baby, SVD. Mom with history of AMA, obesity, and anxiety. On follow-up today mom reports baby was cluster feeding all night. Per mom baby was not latching to her right breast despite her attempting to latch the baby directly to her nipple and attempting to latch with a nipple shield. Baby with wet and stool diapers. Has patient been taught Hand Expression?: Yes Does the patient have breastfeeding experience prior to this delivery?: No  Feeding Mother's Current Feeding Choice: Breast Milk Assisted mom with breastfeeding. Provided tips and strategies to maximize position and latch technique. Baby required a #20 mm nipple shield to achieve latch at right breast. Baby sustained latch and breastfed well at mother's right breast with nipple shield.Baby latched directly to mom's left nipple. Multiple swallows noted throughout breastfeeding.Mom reported she feels more comfortable with breastfeeding after this feeding session. LATCH Score Latch: Grasps breast easily, tongue down, lips flanged, rhythmical sucking. (Baby took 5 attempts before latching well but once latched baby breastfed well.)  Audible Swallowing: Spontaneous and intermittent  Type of Nipple: Everted at rest and after stimulation  Comfort (Breast/Nipple): Filling, red/small blisters or bruises, mild/mod discomfort  Hold (Positioning): Assistance needed to correctly position infant at breast and maintain latch.  LATCH Score: 8   Lactation Tools Discussed/Used Tools: Nipple Shields Nipple shield size: 20 (Baby with latch difficulty at the right breast.)  Interventions Interventions: Breast feeding basics reviewed;Assisted with latch;Breast  massage;Hand express;Breast compression;Adjust position;Support pillows;Education Reviewed what to expect in first days when breastfeeding including 8-12 feeds in 24 hours,how to know the baby is getting enough, cluster feeding. Recommended mom/dad keep baby's feeding/output record and take diary to 1st Pediatric appointment.  Discharge Discharge Education: Engorgement and breast care;Warning signs for feeding baby;Outpatient recommendation Pump: Hands Free;Personal  Consult Status Consult Status: Complete Date: 01/20/23 Follow-up type: In-patient  Update provided to care nurse and Pediatrician.  Fuller Song 01/20/2023, 12:28 PM

## 2023-01-20 NOTE — Progress Notes (Signed)
Patient discharged home with family.  Discharge instructions, when to follow up, and prescriptions reviewed with patient.  Patient verbalized understanding. Patient will be escorted out by auxiliary.   

## 2023-02-01 ENCOUNTER — Telehealth: Payer: Self-pay

## 2023-02-01 NOTE — Telephone Encounter (Signed)
[facility]- Discharge Call Backs-Left Voicemail about the following below. 1-Do you have any questions or concerns about yourself as you heal?  C-Sec-Is your dressing off?/Vag Del? 2-Any concerns or questions about your baby? Is your baby eating, peeing,pooping well? 3-Where does your baby sleep in your home? Reviewed ABC's of safe sleep. 4-How was your stay at the hospital? 5- Did our team work together to care for you? You should be receiving a survey in the mail soon.   We would really appreciate it if you could fill that out for us and return it in the mail.  We value the feedback to make improvements and continue the great work we do.   If you have any questions please feel free to call me back at [phone]
